# Patient Record
Sex: Female | Born: 1985 | Race: White | Hispanic: Yes | Marital: Married | Smoking: Never smoker
Health system: Southern US, Community
[De-identification: ages and names within clinical notes are randomized; demographics above are authoritative.]

## PROBLEM LIST (undated history)

## (undated) DIAGNOSIS — E1169 Type 2 diabetes mellitus with other specified complication: Secondary | ICD-10-CM

## (undated) DIAGNOSIS — E669 Obesity, unspecified: Principal | ICD-10-CM

## (undated) HISTORY — DX: Type 2 diabetes mellitus with other specified complication: E11.69

## (undated) HISTORY — DX: Obesity, unspecified: E66.9

---

## 2009-04-25 ENCOUNTER — Ambulatory Visit (HOSPITAL_COMMUNITY): Admission: RE | Admit: 2009-04-25 | Discharge: 2009-04-25 | Payer: Self-pay | Admitting: Family Medicine

## 2009-05-06 DIAGNOSIS — E1169 Type 2 diabetes mellitus with other specified complication: Secondary | ICD-10-CM

## 2009-05-06 DIAGNOSIS — E669 Obesity, unspecified: Secondary | ICD-10-CM

## 2009-05-06 HISTORY — DX: Type 2 diabetes mellitus with other specified complication: E66.9

## 2009-05-06 HISTORY — DX: Type 2 diabetes mellitus with other specified complication: E11.69

## 2009-09-09 ENCOUNTER — Ambulatory Visit (HOSPITAL_COMMUNITY): Admission: RE | Admit: 2009-09-09 | Discharge: 2009-09-09 | Payer: Self-pay | Admitting: General Surgery

## 2009-09-09 HISTORY — PX: LAPAROSCOPIC CHOLECYSTECTOMY: SUR755

## 2010-01-05 ENCOUNTER — Other Ambulatory Visit
Admission: RE | Admit: 2010-01-05 | Discharge: 2010-01-05 | Payer: Self-pay | Source: Home / Self Care | Admitting: Obstetrics & Gynecology

## 2010-04-21 LAB — SURGICAL PCR SCREEN
MRSA, PCR: NEGATIVE
Staphylococcus aureus: NEGATIVE

## 2010-04-21 LAB — BASIC METABOLIC PANEL
BUN: 8 mg/dL (ref 6–23)
Calcium: 9.8 mg/dL (ref 8.4–10.5)
Chloride: 105 mEq/L (ref 96–112)
Creatinine, Ser: 0.48 mg/dL (ref 0.4–1.2)
Sodium: 137 mEq/L (ref 135–145)

## 2010-04-21 LAB — CBC
Hemoglobin: 14 g/dL (ref 12.0–15.0)
MCH: 31.6 pg (ref 26.0–34.0)
MCHC: 34.5 g/dL (ref 30.0–36.0)
MCV: 91.5 fL (ref 78.0–100.0)
Platelets: 232 10*3/uL (ref 150–400)
RBC: 4.42 MIL/uL (ref 3.87–5.11)
RDW: 12.2 % (ref 11.5–15.5)
WBC: 5.7 10*3/uL (ref 4.0–10.5)

## 2010-04-21 LAB — GLUCOSE, CAPILLARY

## 2010-05-14 ENCOUNTER — Encounter: Payer: Self-pay | Admitting: Obstetrics & Gynecology

## 2010-05-14 DIAGNOSIS — E669 Obesity, unspecified: Secondary | ICD-10-CM

## 2010-05-14 DIAGNOSIS — I10 Essential (primary) hypertension: Secondary | ICD-10-CM | POA: Insufficient documentation

## 2010-05-14 DIAGNOSIS — O34219 Maternal care for unspecified type scar from previous cesarean delivery: Secondary | ICD-10-CM

## 2010-05-14 DIAGNOSIS — E119 Type 2 diabetes mellitus without complications: Secondary | ICD-10-CM | POA: Insufficient documentation

## 2010-05-16 DIAGNOSIS — O34219 Maternal care for unspecified type scar from previous cesarean delivery: Secondary | ICD-10-CM | POA: Insufficient documentation

## 2012-01-12 IMAGING — US US ABDOMEN COMPLETE
1 series · 14 of 25 positions shown · non-contrast
Comparison: None.

CLINICAL DATA: Right upper quadrant pain.

COMPLETE ABDOMINAL ULTRASOUND

[Series 1: us abdomen complete · 0.26mm/px · 14 of 56 slices shown]
[im 1/56]
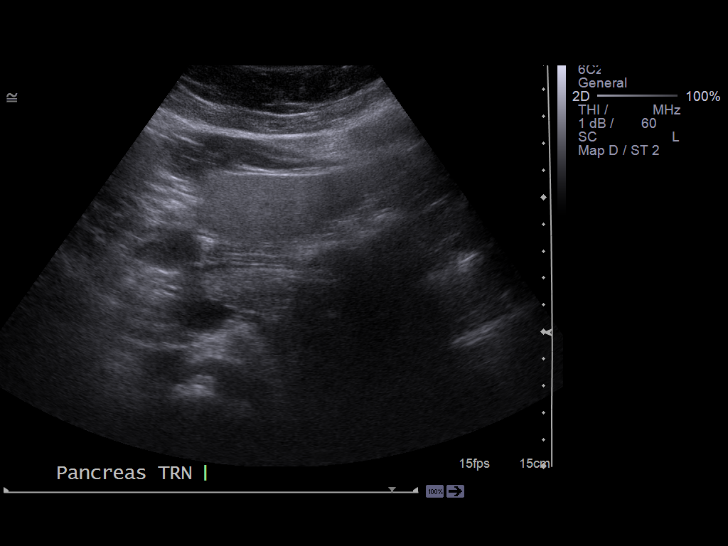
[im 5/56]
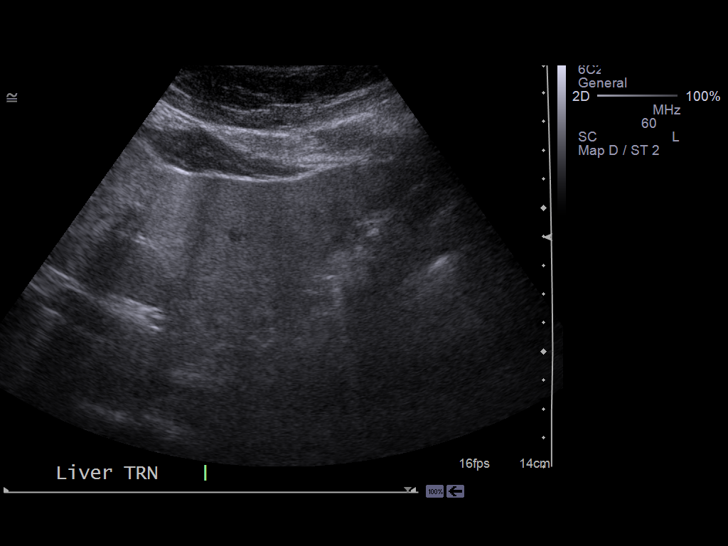
[im 10/56]
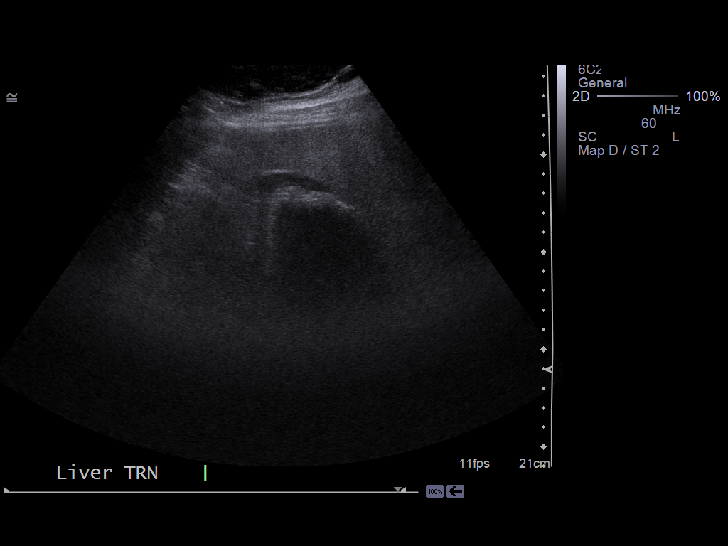
[im 14/56]
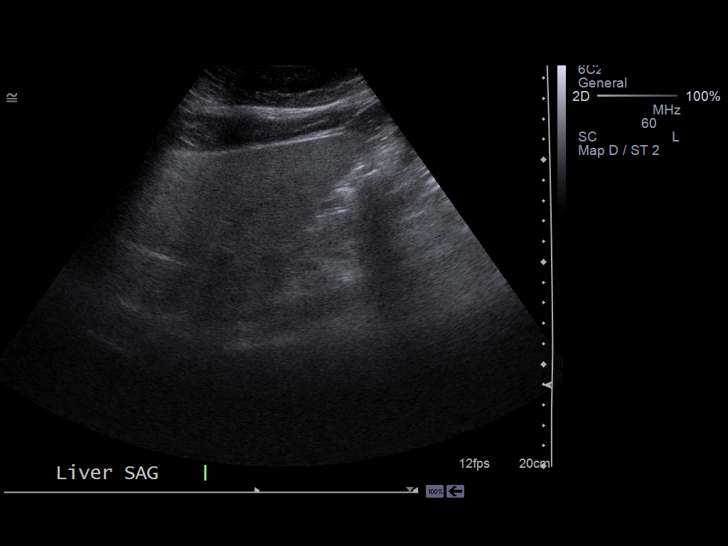
[im 19/56]
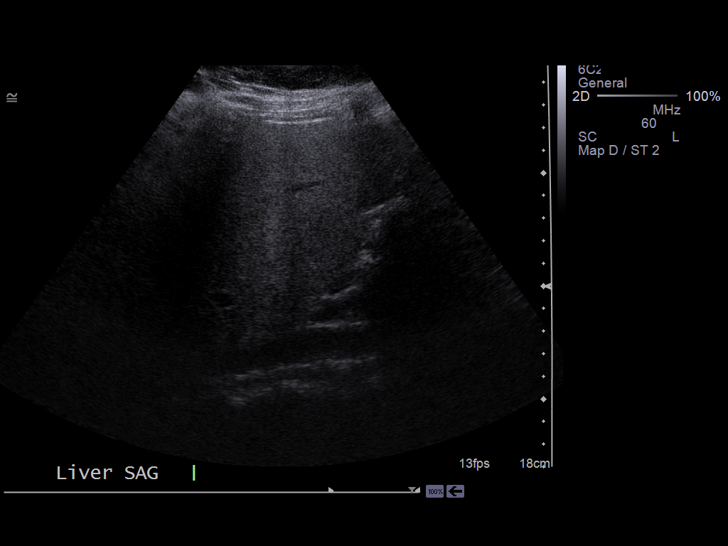
[im 21/56]
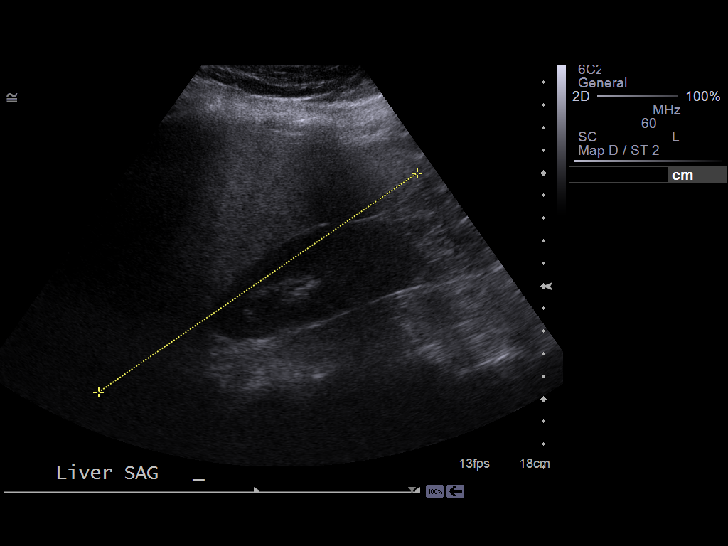
[im 26/56]
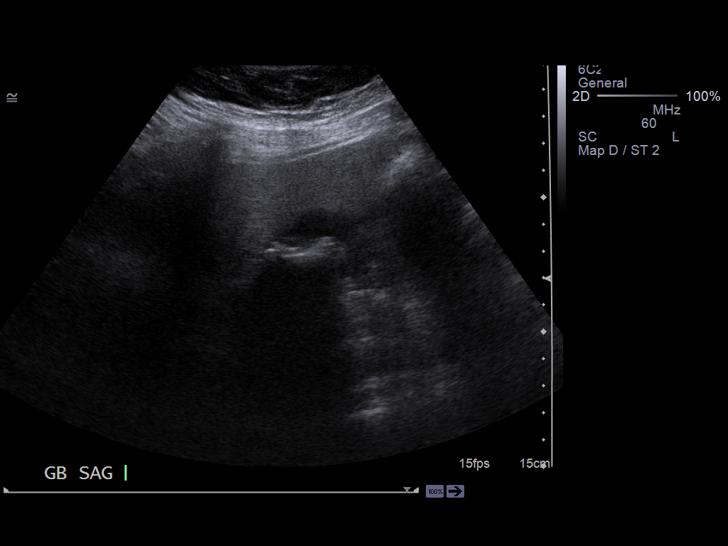
[im 30/56]
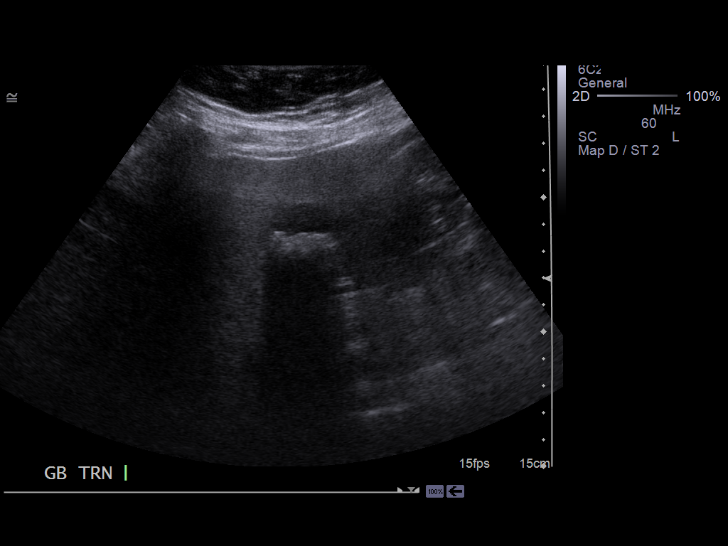
[im 35/56]
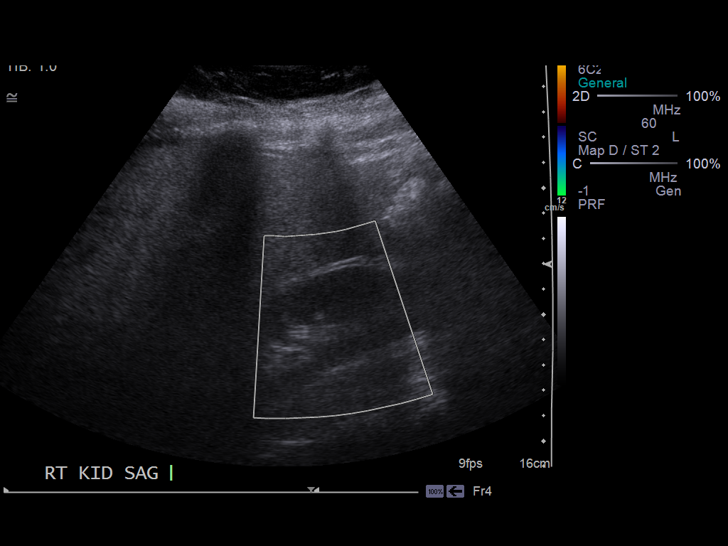
[im 37/56]
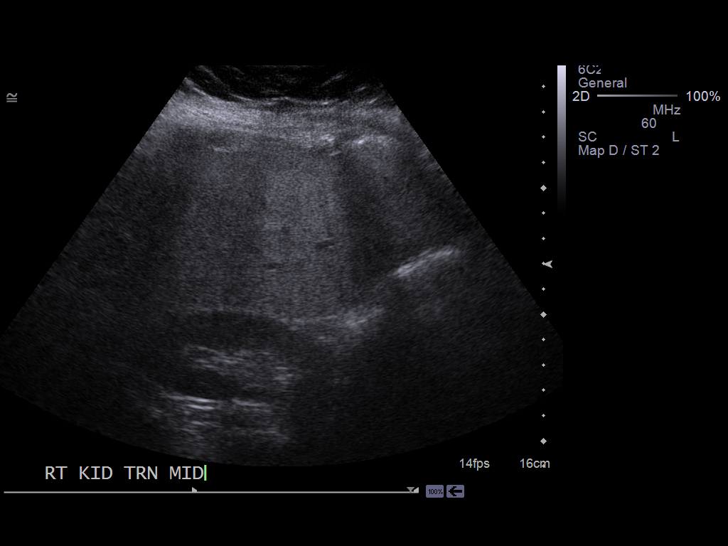
[im 42/56]
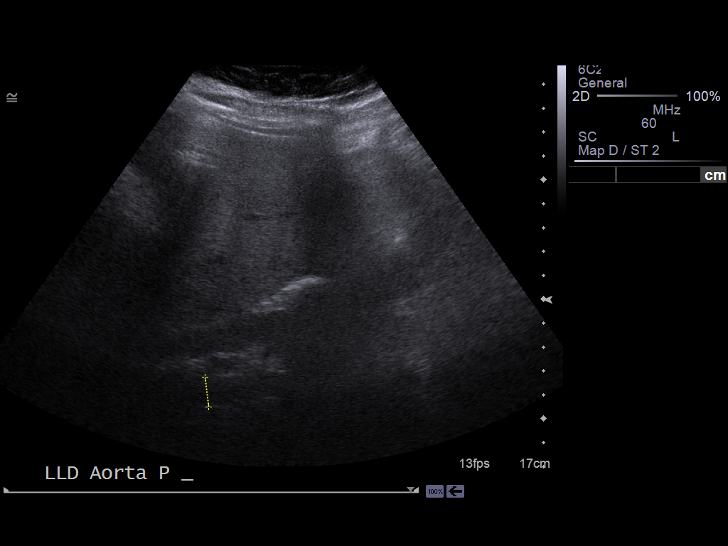
[im 46/56]
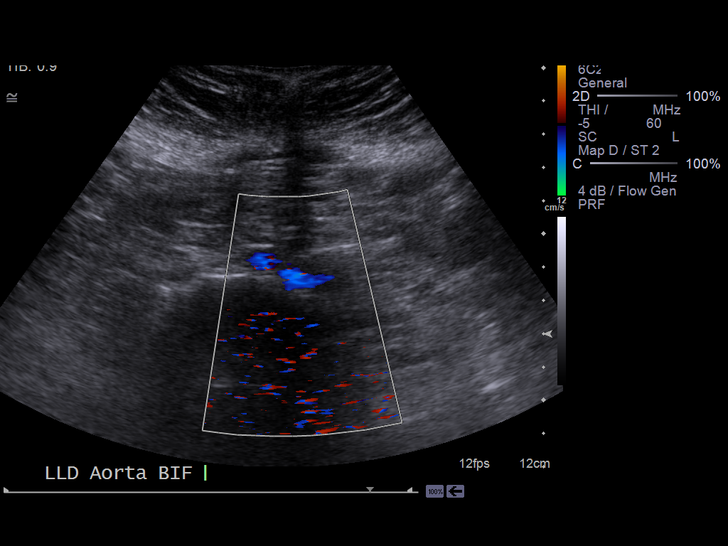
[im 51/56]
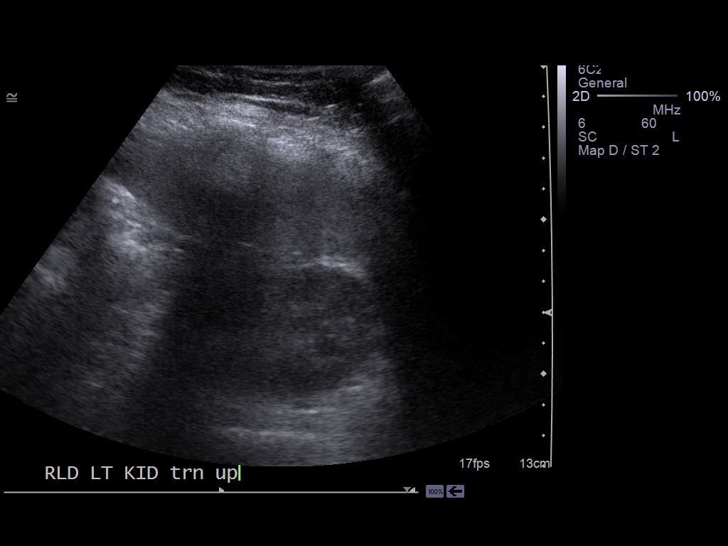
[im 56/56]
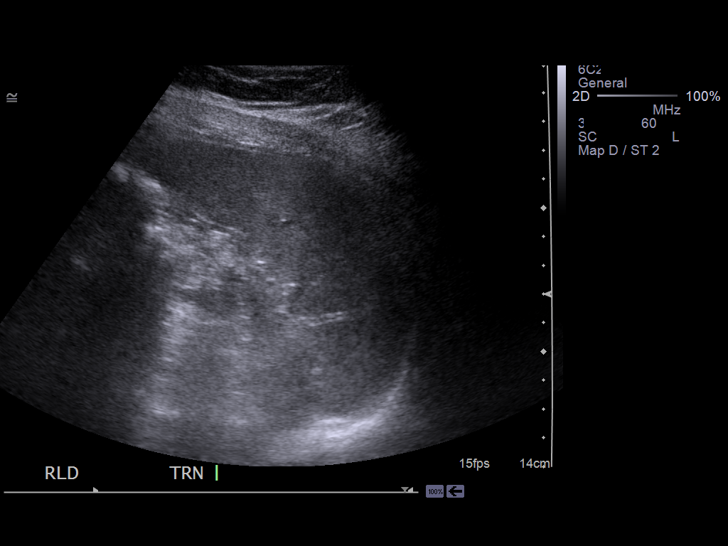

[14 of 25 positions shown; findings below may reference images not displayed]

FINDINGS: Gallbladder:  Multiple shadowing echogenic stones are seen in the
gallbladder.  No sonographic Murphy's sign or gallbladder wall
thickening.

Common bile duct:  Measures 4 mm, within normal limits.

Liver:  Diffusely increased in echogenicity.

IVC:  Visualized.

Pancreas:  Negative.

Spleen:  Measures 11.9 cm, negative.

Right Kidney:  Measures 10.4 cm, negative.

Left Kidney:  Measures 12.0 cm, negative.

Abdominal aorta:  No aneurysm identified.
IMPRESSION: 1.  Cholelithiasis without acute cholecystitis.
2.  Fatty liver.

## 2015-01-20 ENCOUNTER — Other Ambulatory Visit: Payer: Self-pay | Admitting: Obstetrics & Gynecology

## 2015-01-20 DIAGNOSIS — O3680X0 Pregnancy with inconclusive fetal viability, not applicable or unspecified: Secondary | ICD-10-CM

## 2015-01-24 ENCOUNTER — Other Ambulatory Visit: Payer: Self-pay | Admitting: Obstetrics & Gynecology

## 2015-01-24 ENCOUNTER — Ambulatory Visit (INDEPENDENT_AMBULATORY_CARE_PROVIDER_SITE_OTHER): Payer: Self-pay

## 2015-01-24 DIAGNOSIS — Z3A01 Less than 8 weeks gestation of pregnancy: Secondary | ICD-10-CM

## 2015-01-24 DIAGNOSIS — O3680X0 Pregnancy with inconclusive fetal viability, not applicable or unspecified: Secondary | ICD-10-CM

## 2015-01-24 DIAGNOSIS — O3481 Maternal care for other abnormalities of pelvic organs, first trimester: Secondary | ICD-10-CM

## 2015-01-24 NOTE — Progress Notes (Signed)
US TA/TV:  5+3wks,single IUP pos fht 105 bpm,normal rt ov, lt corpus luteal cyst (simple) 4.7 x 3.8 x 4.3cm,small amount of cul de sac fluid,crl 2mm

## 2015-02-08 ENCOUNTER — Encounter: Payer: Self-pay | Admitting: Advanced Practice Midwife

## 2015-02-23 ENCOUNTER — Ambulatory Visit (INDEPENDENT_AMBULATORY_CARE_PROVIDER_SITE_OTHER): Payer: Self-pay | Admitting: Advanced Practice Midwife

## 2015-02-23 ENCOUNTER — Encounter: Payer: Self-pay | Admitting: Advanced Practice Midwife

## 2015-02-23 ENCOUNTER — Other Ambulatory Visit (HOSPITAL_COMMUNITY)
Admission: RE | Admit: 2015-02-23 | Discharge: 2015-02-23 | Disposition: A | Discharge status: Home / Self Care | Payer: Self-pay | Source: Ambulatory Visit | Attending: Advanced Practice Midwife | Admitting: Advanced Practice Midwife

## 2015-02-23 VITALS — BP 120/70 | HR 74 | Ht 62.5 in | Wt 181.0 lb

## 2015-02-23 DIAGNOSIS — Z331 Pregnant state, incidental: Secondary | ICD-10-CM

## 2015-02-23 DIAGNOSIS — O10911 Unspecified pre-existing hypertension complicating pregnancy, first trimester: Secondary | ICD-10-CM

## 2015-02-23 DIAGNOSIS — Z369 Encounter for antenatal screening, unspecified: Secondary | ICD-10-CM

## 2015-02-23 DIAGNOSIS — Z1151 Encounter for screening for human papillomavirus (HPV): Secondary | ICD-10-CM | POA: Insufficient documentation

## 2015-02-23 DIAGNOSIS — O09899 Supervision of other high risk pregnancies, unspecified trimester: Secondary | ICD-10-CM | POA: Insufficient documentation

## 2015-02-23 DIAGNOSIS — Z124 Encounter for screening for malignant neoplasm of cervix: Secondary | ICD-10-CM

## 2015-02-23 DIAGNOSIS — Z1389 Encounter for screening for other disorder: Secondary | ICD-10-CM

## 2015-02-23 DIAGNOSIS — E1169 Type 2 diabetes mellitus with other specified complication: Secondary | ICD-10-CM

## 2015-02-23 DIAGNOSIS — E669 Obesity, unspecified: Secondary | ICD-10-CM

## 2015-02-23 DIAGNOSIS — Z01419 Encounter for gynecological examination (general) (routine) without abnormal findings: Secondary | ICD-10-CM | POA: Insufficient documentation

## 2015-02-23 DIAGNOSIS — O34219 Maternal care for unspecified type scar from previous cesarean delivery: Secondary | ICD-10-CM

## 2015-02-23 DIAGNOSIS — Z3A10 10 weeks gestation of pregnancy: Secondary | ICD-10-CM

## 2015-02-23 DIAGNOSIS — I10 Essential (primary) hypertension: Secondary | ICD-10-CM

## 2015-02-23 DIAGNOSIS — Z0283 Encounter for blood-alcohol and blood-drug test: Secondary | ICD-10-CM

## 2015-02-23 DIAGNOSIS — O09891 Supervision of other high risk pregnancies, first trimester: Secondary | ICD-10-CM

## 2015-02-23 LAB — POCT URINALYSIS DIPSTICK
Blood, UA: NEGATIVE
Ketones, UA: NEGATIVE
LEUKOCYTES UA: NEGATIVE
Nitrite, UA: NEGATIVE
PROTEIN UA: NEGATIVE

## 2015-02-23 MED ORDER — PRENATAL 27-0.8 MG PO TABS: 1.0000 | ORAL_TABLET | Freq: Every day | ORAL | Status: DC

## 2015-02-23 NOTE — Patient Instructions (Addendum)
Safe Medications in Pregnancy   Acne: Benzoyl Peroxide Salicylic Acid  Backache/Headache: Tylenol: 2 regular strength every 4 hours OR              2 Extra strength every 6 hours  Colds/Coughs/Allergies: Benadryl (alcohol free) 25 mg every 6 hours as needed Breath right strips Claritin Cepacol throat lozenges Chloraseptic throat spray Cold-Eeze- up to three times per day Cough drops, alcohol free Flonase (by prescription only) Guaifenesin Mucinex Robitussin DM (plain only, alcohol free) Saline nasal spray/drops Sudafed (pseudoephedrine) & Actifed ** use only after [redacted] weeks gestation and if you do not have high blood pressure Tylenol Vicks Vaporub Zinc lozenges Zyrtec   Constipation: Colace Ducolax suppositories Fleet enema Glycerin suppositories Metamucil Milk of magnesia Miralax Senokot Smooth move tea  Diarrhea: Kaopectate Imodium A-D  *NO pepto Bismol  Hemorrhoids: Anusol Anusol HC Preparation H Tucks  Indigestion: Tums Maalox Mylanta Zantac  Pepcid  Insomnia: Benadryl (alcohol free) 25mg every 6 hours as needed Tylenol PM Unisom, no Gelcaps  Leg Cramps: Tums MagGel  Nausea/Vomiting:  Bonine Dramamine Emetrol Ginger extract Sea bands Meclizine  Nausea medication to take during pregnancy:  Unisom (doxylamine succinate 25 mg tablets) Take one tablet daily at bedtime. If symptoms are not adequately controlled, the dose can be increased to a maximum recommended dose of two tablets daily (1/2 tablet in the morning, 1/2 tablet mid-afternoon and one at bedtime). Vitamin B6 100mg tablets. Take one tablet twice a day (up to 200 mg per day).  Skin Rashes: Aveeno products Benadryl cream or 25mg every 6 hours as needed Calamine Lotion 1% cortisone cream  Yeast infection: Gyne-lotrimin 7 Monistat 7   **If taking multiple medications, please check labels to avoid duplicating the same active ingredients **take medication as directed on  the label ** Do not exceed 4000 mg of tylenol in 24 hours **Do not take medications that contain aspirin or ibuprofen    First Trimester of Pregnancy The first trimester of pregnancy is from week 1 until the end of week 12 (months 1 through 3). A week after a sperm fertilizes an egg, the egg will implant on the wall of the uterus. This embryo will begin to develop into a baby. Genes from you and your partner are forming the baby. The female genes determine whether the baby is a boy or a girl. At 6-8 weeks, the eyes and face are formed, and the heartbeat can be seen on ultrasound. At the end of 12 weeks, all the baby's organs are formed.  Now that you are pregnant, you will want to do everything you can to have a healthy baby. Two of the most important things are to get good prenatal care and to follow your health care provider's instructions. Prenatal care is all the medical care you receive before the baby's birth. This care will help prevent, find, and treat any problems during the pregnancy and childbirth. BODY CHANGES Your body goes through many changes during pregnancy. The changes vary from woman to woman.   You may gain or lose a couple of pounds at first.  You may feel sick to your stomach (nauseous) and throw up (vomit). If the vomiting is uncontrollable, call your health care provider.  You may tire easily.  You may develop headaches that can be relieved by medicines approved by your health care provider.  You may urinate more often. Painful urination may mean you have a bladder infection.  You may develop heartburn as a result of your   pregnancy.  You may develop constipation because certain hormones are causing the muscles that push waste through your intestines to slow down.  You may develop hemorrhoids or swollen, bulging veins (varicose veins).  Your breasts may begin to grow larger and become tender. Your nipples may stick out more, and the tissue that surrounds them (areola)  may become darker.  Your gums may bleed and may be sensitive to brushing and flossing.  Dark spots or blotches (chloasma, mask of pregnancy) may develop on your face. This will likely fade after the baby is born.  Your menstrual periods will stop.  You may have a loss of appetite.  You may develop cravings for certain kinds of food.  You may have changes in your emotions from day to day, such as being excited to be pregnant or being concerned that something may go wrong with the pregnancy and baby.  You may have more vivid and strange dreams.  You may have changes in your hair. These can include thickening of your hair, rapid growth, and changes in texture. Some women also have hair loss during or after pregnancy, or hair that feels dry or thin. Your hair will most likely return to normal after your baby is born. WHAT TO EXPECT AT YOUR PRENATAL VISITS During a routine prenatal visit:  You will be weighed to make sure you and the baby are growing normally.  Your blood pressure will be taken.  Your abdomen will be measured to track your baby's growth.  The fetal heartbeat will be listened to starting around week 10 or 12 of your pregnancy.  Test results from any previous visits will be discussed. Your health care provider may ask you:  How you are feeling.  If you are feeling the baby move.  If you have had any abnormal symptoms, such as leaking fluid, bleeding, severe headaches, or abdominal cramping.  If you are using any tobacco products, including cigarettes, chewing tobacco, and electronic cigarettes.  If you have any questions. Other tests that may be performed during your first trimester include:  Blood tests to find your blood type and to check for the presence of any previous infections. They will also be used to check for low iron levels (anemia) and Rh antibodies. Later in the pregnancy, blood tests for diabetes will be done along with other tests if problems  develop.  Urine tests to check for infections, diabetes, or protein in the urine.  An ultrasound to confirm the proper growth and development of the baby.  An amniocentesis to check for possible genetic problems.  Fetal screens for spina bifida and Down syndrome.  You may need other tests to make sure you and the baby are doing well.  HIV (human immunodeficiency virus) testing. Routine prenatal testing includes screening for HIV, unless you choose not to have this test. HOME CARE INSTRUCTIONS  Medicines  Follow your health care provider's instructions regarding medicine use. Specific medicines may be either safe or unsafe to take during pregnancy.  Take your prenatal vitamins as directed.  If you develop constipation, try taking a stool softener if your health care provider approves. Diet  Eat regular, well-balanced meals. Choose a variety of foods, such as meat or vegetable-based protein, fish, milk and low-fat dairy products, vegetables, fruits, and whole grain breads and cereals. Your health care provider will help you determine the amount of weight gain that is right for you.  Avoid raw meat and uncooked cheese. These carry germs that can cause  birth defects in the baby.  Eating four or five small meals rather than three large meals a day may help relieve nausea and vomiting. If you start to feel nauseous, eating a few soda crackers can be helpful. Drinking liquids between meals instead of during meals also seems to help nausea and vomiting.  If you develop constipation, eat more high-fiber foods, such as fresh vegetables or fruit and whole grains. Drink enough fluids to keep your urine clear or pale yellow. Activity and Exercise  Exercise only as directed by your health care provider. Exercising will help you:  Control your weight.  Stay in shape.  Be prepared for labor and delivery.  Experiencing pain or cramping in the lower abdomen or low back is a good sign that you  should stop exercising. Check with your health care provider before continuing normal exercises.  Try to avoid standing for long periods of time. Move your legs often if you must stand in one place for a long time.  Avoid heavy lifting.  Wear low-heeled shoes, and practice good posture.  You may continue to have sex unless your health care provider directs you otherwise. Relief of Pain or Discomfort  Wear a good support bra for breast tenderness.   Take warm sitz baths to soothe any pain or discomfort caused by hemorrhoids. Use hemorrhoid cream if your health care provider approves.   Rest with your legs elevated if you have leg cramps or low back pain.  If you develop varicose veins in your legs, wear support hose. Elevate your feet for 15 minutes, 3-4 times a day. Limit salt in your diet. Prenatal Care  Schedule your prenatal visits by the twelfth week of pregnancy. They are usually scheduled monthly at first, then more often in the last 2 months before delivery.  Write down your questions. Take them to your prenatal visits.  Keep all your prenatal visits as directed by your health care provider. Safety  Wear your seat belt at all times when driving.  Make a list of emergency phone numbers, including numbers for family, friends, the hospital, and police and fire departments. General Tips  Ask your health care provider for a referral to a local prenatal education class. Begin classes no later than at the beginning of month 6 of your pregnancy.  Ask for help if you have counseling or nutritional needs during pregnancy. Your health care provider can offer advice or refer you to specialists for help with various needs.  Do not use hot tubs, steam rooms, or saunas.  Do not douche or use tampons or scented sanitary pads.  Do not cross your legs for long periods of time.  Avoid cat litter boxes and soil used by cats. These carry germs that can cause birth defects in the baby  and possibly loss of the fetus by miscarriage or stillbirth.  Avoid all smoking, herbs, alcohol, and medicines not prescribed by your health care provider. Chemicals in these affect the formation and growth of the baby.  Do not use any tobacco products, including cigarettes, chewing tobacco, and electronic cigarettes. If you need help quitting, ask your health care provider. You may receive counseling support and other resources to help you quit.  Schedule a dentist appointment. At home, brush your teeth with a soft toothbrush and be gentle when you floss. SEEK MEDICAL CARE IF:   You have dizziness.  You have mild pelvic cramps, pelvic pressure, or nagging pain in the abdominal area.  You have persistent   nausea, vomiting, or diarrhea.  You have a bad smelling vaginal discharge.  You have pain with urination.  You notice increased swelling in your face, hands, legs, or ankles. SEEK IMMEDIATE MEDICAL CARE IF:   You have a fever.  You are leaking fluid from your vagina.  You have spotting or bleeding from your vagina.  You have severe abdominal cramping or pain.  You have rapid weight gain or loss.  You vomit blood or material that looks like coffee grounds.  You are exposed to [place] measles and have never had them.  You are exposed to fifth disease or chickenpox.  You develop a severe headache.  You have shortness of breath.  You have any kind of trauma, such as from a fall or a car accident.   This information is not intended to replace advice given to you by your health care provider. Make sure you discuss any questions you have with your health care provider.   Document Released: 01/16/2001 Document Revised: 02/12/2014 Document Reviewed: 12/02/2012 Elsevier Interactive Patient Education 2016 [employer] Take blood sugar before you eat and 2 hours after each meal  Write this down in a notebook and bring to every appointment.    Appointment is with [facility] (I will call you with the appointment date/time)   [facility] [address] [place], [place], [phone]

## 2015-02-23 NOTE — Progress Notes (Signed)
Subjective:    Lindsay Morrison is a G2P1001 [redacted]w[redacted]d being seen today for her first obstetrical visit.  Her obstetrical history is significant for 2 CS.  Pregnancy history fully reviewed. She was dx with Type 2 DM and HTN "a few years ago".  She has not been on BP meds since 1 month before getting pregnant: was told not to take lisinopril while trying to conceive, but was not given a replacement.  Takes Novolin 10 BID, but hasn't checked her BS "in 2 years" d/t cost of test strips, meter.    Patient reports no complaints.  Filed Vitals:   02/23/15 1055 02/23/15 1223  BP: 120/70   Pulse: 74   Height:  5' 2.5" (1.588 m)  Weight: 181 lb (82.101 kg)     HISTORY: OB History  Gravida Para Term Preterm AB SAB TAB Ectopic Multiple Living  0 0 0 0 0 0 1    # Outcome Date GA Lbr Len/2nd Weight Sex Delivery Anes PTL Lv  3 Current           2 Gravida 08/12/10   11 lb 4 oz (5.103 kg) F CS-LTranv  N   1 Term 2004 [redacted]w[redacted]d  8 lb 8 oz (3.856 kg) M CS-Unspec   Y     Comments: unknown scar     Past Medical History  Diagnosis Date  . Hypertension   . Diabetes mellitus type 2 in obese (HCC) 05/2009    Began on Metformin 05/2009, patient stopped taking on her own 11/2009   Past Surgical History  Procedure Laterality Date  . Laparoscopic cholecystectomy  09/09/2009    Dr.Jenkins  . Cesarean section  2004    Performed in [place] due to fetal distress unknown scar   No family history on file.   Exam       Pelvic Exam:    Perineum: Normal Perineum   Vulva: normal   Vagina:  normal mucosa, normal discharge, no palpable nodules   Uterus Normal, Gravid, FH: 9     Cervix: normal   Adnexa: Not palpable   Urinary:  urethral meatus normal    System:     Skin: normal coloration and turgor, no rashes    Neurologic: oriented, normal, normal mood   Extremities: normal strength, tone, and muscle mass   HEENT PERRLA   Mouth/Teeth mucous membranes moist, normal dentition   Neck supple and no  masses   Cardiovascular: regular rate and rhythm   Respiratory:  appears well, vitals normal, no respiratory distress, acyanotic   Abdomen: soft, non-tender;  FHR: 150us          Assessment:    Pregnancy: G2P1001 Patient Active Problem List   Diagnosis Date Noted  . Supervision of normal pregnancy 02/23/2015  . Previous cesarean delivery, delivered 05/16/2010  . Diabetes mellitus type 2 in obese (HCC)   . Hypertension         Plan:     Initial labs ordered, but not drawn (was going to cost  > $600.  Hgb A1C added to PN1.    Rx for prenatal vitamins sent Problem list reviewed and updated  Encouraged well-balanced diet Genetic Screening discussed Quad Screen: requested.  Ultrasound discussed; fetal survey: requested.  Discussed transferring care to [facility] d/t having to pay for each PNV at [facility], and that she will need twice weekly visits after 32 weeks d/t DM/HTN--pt wants to do that Appointment made for 03/07/15 at 0930.  CRESENZO-DISHMAN,Airen Dales 02/23/2015

## 2015-02-25 LAB — CYTOLOGY - PAP

## 2015-03-07 ENCOUNTER — Ambulatory Visit: Payer: Self-pay | Admitting: *Deleted

## 2015-03-07 ENCOUNTER — Encounter: Payer: Self-pay | Attending: Family Medicine | Admitting: *Deleted

## 2015-03-07 VITALS — Ht 62.0 in | Wt 181.0 lb

## 2015-03-07 DIAGNOSIS — Z3A11 11 weeks gestation of pregnancy: Secondary | ICD-10-CM | POA: Insufficient documentation

## 2015-03-07 DIAGNOSIS — O24419 Gestational diabetes mellitus in pregnancy, unspecified control: Secondary | ICD-10-CM | POA: Insufficient documentation

## 2015-03-07 NOTE — Progress Notes (Signed)
Nutrition note: DM diet education Pt has h/o obesity; pt reports she has had DM for 5 years and has been on insulin the past 2 years. Pt reports eating 2 meals & 2 snacks/d. Pt is taking a PNV. Pt reports nausea occ. & no heartburn. NKFA. Pt reports no walking or physical activity. Pt received verbal & written education in Spanish via an interpreter about DM diet during pregnancy. Encouraged ~30 mins of walking/d. Pt does not have WIC & was told by her lawyer not to while she is pregnant because they are trying to get residency here. Pt stated she does not plan to BF because she will be re-starting Lisinopril once she gives birth - plan to look this medication up in the Medications & Mother's Milk book. Pt agrees to follow DM diet with 3 meals & 1-3 snacks/d with proper CHO/ protein combination. F/u in 2-4 wks Blondell Reveal, MS, RD, LDN, [facility]

## 2015-03-09 NOTE — Progress Notes (Signed)
  Patient was seen on 03/07/15 for Gestational Diabetes self-management . Spanish Interpreter Merrie Roof for visit.  The following learning objectives were met by the patient :   States the definition of Gestational Diabetes  States when to check blood glucose levels  Demonstrates proper blood glucose monitoring techniques  States the effect of stress and exercise on blood glucose levels  States the importance of limiting caffeine and abstaining from alcohol and smoking  Plan:  Consider  increasing your activity level by walking daily as tolerated Begin checking BG before breakfast and 1-2 hours after first bit of breakfast, lunch and dinner after  as directed by MD  Take medication  as directed by MD  Blood glucose monitor given: True Track Lot # Lot [id number] Exp: 03/31/17 Blood glucose reading: FBS '102mg'$ /dl  Patient instructed to monitor glucose levels: FBS: 60 - <90 2 hour: <120  Patient received the following handouts:  Nutrition Diabetes and Pregnancy  Patient will be seen for follow-up as needed.

## 2015-03-14 ENCOUNTER — Encounter: Payer: Self-pay | Admitting: Family Medicine

## 2015-03-14 ENCOUNTER — Ambulatory Visit (INDEPENDENT_AMBULATORY_CARE_PROVIDER_SITE_OTHER): Payer: Self-pay | Admitting: Family Medicine

## 2015-03-14 VITALS — BP 115/67 | HR 79 | Temp 98.5°F | Wt 181.0 lb

## 2015-03-14 DIAGNOSIS — O99211 Obesity complicating pregnancy, first trimester: Secondary | ICD-10-CM

## 2015-03-14 DIAGNOSIS — O09291 Supervision of pregnancy with other poor reproductive or obstetric history, first trimester: Secondary | ICD-10-CM

## 2015-03-14 DIAGNOSIS — O09299 Supervision of pregnancy with other poor reproductive or obstetric history, unspecified trimester: Secondary | ICD-10-CM | POA: Insufficient documentation

## 2015-03-14 DIAGNOSIS — O09891 Supervision of other high risk pregnancies, first trimester: Secondary | ICD-10-CM

## 2015-03-14 DIAGNOSIS — O24911 Unspecified diabetes mellitus in pregnancy, first trimester: Secondary | ICD-10-CM

## 2015-03-14 DIAGNOSIS — O169 Unspecified maternal hypertension, unspecified trimester: Secondary | ICD-10-CM | POA: Insufficient documentation

## 2015-03-14 DIAGNOSIS — O161 Unspecified maternal hypertension, first trimester: Secondary | ICD-10-CM

## 2015-03-14 DIAGNOSIS — O99210 Obesity complicating pregnancy, unspecified trimester: Secondary | ICD-10-CM | POA: Insufficient documentation

## 2015-03-14 DIAGNOSIS — Z113 Encounter for screening for infections with a predominantly sexual mode of transmission: Secondary | ICD-10-CM

## 2015-03-14 DIAGNOSIS — O34219 Maternal care for unspecified type scar from previous cesarean delivery: Secondary | ICD-10-CM

## 2015-03-14 DIAGNOSIS — O24919 Unspecified diabetes mellitus in pregnancy, unspecified trimester: Secondary | ICD-10-CM

## 2015-03-14 DIAGNOSIS — E669 Obesity, unspecified: Secondary | ICD-10-CM

## 2015-03-14 LAB — COMPREHENSIVE METABOLIC PANEL
ALBUMIN: 4 g/dL (ref 3.6–5.1)
ALT: 10 U/L (ref 6–29)
AST: 13 U/L (ref 10–30)
Alkaline Phosphatase: 49 U/L (ref 33–115)
BUN: 6 mg/dL — ABNORMAL LOW (ref 7–25)
CHLORIDE: 104 mmol/L (ref 98–110)
CO2: 22 mmol/L (ref 20–31)
CREATININE: 0.43 mg/dL — AB (ref 0.50–1.10)
Calcium: 9.1 mg/dL (ref 8.6–10.2)
Glucose, Bld: 97 mg/dL (ref 65–99)
POTASSIUM: 3.8 mmol/L (ref 3.5–5.3)
SODIUM: 136 mmol/L (ref 135–146)
TOTAL PROTEIN: 7 g/dL (ref 6.1–8.1)
Total Bilirubin: 0.3 mg/dL (ref 0.2–1.2)

## 2015-03-14 LAB — POCT URINALYSIS DIP (DEVICE)
Bilirubin Urine: NEGATIVE
GLUCOSE, UA: 100 mg/dL — AB
HGB URINE DIPSTICK: NEGATIVE
Ketones, ur: NEGATIVE mg/dL
Leukocytes, UA: NEGATIVE
NITRITE: NEGATIVE
PROTEIN: NEGATIVE mg/dL
SPECIFIC GRAVITY, URINE: 1.015 (ref 1.005–1.030)
UROBILINOGEN UA: 1 mg/dL (ref 0.0–1.0)
pH: 6 (ref 5.0–8.0)

## 2015-03-14 LAB — HEMOGLOBIN A1C
Hgb A1c MFr Bld: 5.6 % (ref <5.7)
MEAN PLASMA GLUCOSE: 114 mg/dL (ref <117)

## 2015-03-14 MED ORDER — ASPIRIN EC 81 MG PO TBEC: 81.0000 mg | DELAYED_RELEASE_TABLET | Freq: Every day | ORAL | Status: DC

## 2015-03-14 NOTE — Progress Notes (Signed)
Subjective:  Lindsay Morrison is a 30 y.o. G3P2001 at [redacted]w[redacted]d being seen today for ongoing prenatal care.  She is currently monitored for the following issues for this high-risk pregnancy and has Diabetes mellitus affecting pregnancy, antepartum; Hypertension; Previous cesarean delivery, delivered; Supervision of other high-risk pregnancy; Hypertension affecting pregnancy; and Obesity affecting pregnancy, antepartum on her problem list.  Patient reports no complaints.  Contractions: Not present. Vag. Bleeding: None.  Movement: Absent. Denies leaking of fluid.   The following portions of the patient's history were reviewed and updated as appropriate: allergies, current medications, past family history, past medical history, past social history, past surgical history and problem list. Problem list updated.  Objective:   Filed Vitals:   03/14/15 0808  BP: 115/67  Pulse: 79  Temp: 98.5 F (36.9 C)  Weight: 181 lb (82.101 kg)    Fetal Status: Fetal Heart Rate (bpm): 150   Movement: Absent     General:  Alert, oriented and cooperative. Patient is in no acute distress.  Skin: Skin is warm and dry. No rash noted.   Cardiovascular: Normal heart rate noted  Respiratory: Normal respiratory effort, no problems with respiration noted  Abdomen: Soft, gravid, appropriate for gestational age. Pain/Pressure: Absent     Pelvic: Vag. Bleeding: None     Cervical exam deferred        Extremities: Normal range of motion.  Edema: None  Mental Status: Normal mood and affect. Normal behavior. Normal judgment and thought content.   Urinalysis: Urine Protein: Negative Urine Glucose: 1+  Assessment and Plan:  Pregnancy: G3P2001 at [redacted]w[redacted]d  1. Supervision of other high-risk pregnancy, first trimester - Added pregnancy box - Prenatal (OB Panel) - [place] MFM OB COMP + 14 WK; Future - Hemoglobin A1C - Comprehensive metabolic panel - Protein / Creatinine Ratio, Urine - GC/Chlamydia probe amp ([place])not at  [facility]  2. Previous cesarean delivery, delivered Plan for CS   3. Diabetes mellitus affecting pregnancy, first trimester (HCC) - Currently on NPH 10 u, not on prandial - Hemoglobin A1C today - Comprehensive metabolic panel - Protein / Creatinine Ratio, Urine - appt with DM education in 1 week to review sugar, consider starting regular insulin  4. Hypertension affecting pregnancy, first trimester - patient reports she "does not have HTN" and was only on BP med for her "kidneys"  5. Obesity affecting pregnancy, antepartum, first trimester - recommend 11-15 lbs weight gain   Preterm labor symptoms and general obstetric precautions including but not limited to vaginal bleeding, contractions, leaking of fluid and fetal movement were reviewed in detail with the patient. Please refer to After Visit Summary for other counseling recommendations.  Return in about 2 weeks (around 03/28/2015) for Routine prenatal care.   Federico Flake, MD

## 2015-03-14 NOTE — Progress Notes (Signed)
U/S scheduled for 05/02/2015 :30AM

## 2015-03-14 NOTE — Progress Notes (Signed)
Used Lindsay Morrison. Here for first visit. Transferring from [facility]. Given new pregnancy information. C/o pain/ feels like something loose  when turns in bed at night. Declines flu.

## 2015-03-14 NOTE — Patient Instructions (Signed)
Primer trimestre de embarazo  (First Trimester of Pregnancy)  El primer trimestre de embarazo se extiende desde la semana 1 hasta el final de la semana 12 (mes 1 al mes 3). Una semana después de que un espermatozoide fecunda un óvulo, este se implantará en la pared uterina. Este embrión comenzará a desarrollarse hasta convertirse en un bebé. Sus genes y los de su pareja forman el bebé. Los genes del varón determinan si será un niño o una niña. Entre la semana 6 y la 8, se forman los ojos y el rostro, y los latidos del corazón pueden verse en la ecografía. Al final de las 12 semanas, todos los órganos del bebé están formados.   Ahora que está embarazada, querrá hacer todo lo que esté a su alcance para tener un bebé sano. Dos de las cosas más importantes son tener una buena atención prenatal y seguir las indicaciones del médico. La atención prenatal incluye toda la asistencia médica que usted recibe antes del nacimiento del bebé. Esta ayudará a prevenir, detectar y tratar cualquier problema durante el embarazo y el parto.  CAMBIOS EN EL ORGANISMO  Su organismo atraviesa por muchos cambios durante el embarazo, y estos varían de una mujer a otra.   · Al principio, puede aumentar o bajar algunos kilos.  · Puede tener malestar estomacal (náuseas) y vomitar. Si no puede controlar los vómitos, llame al médico.  · Puede cansarse con facilidad.  · Es posible que tenga dolores de cabeza que pueden aliviarse con los medicamentos que el médico le permita tomar.  · Puede orinar con mayor frecuencia. El dolor al orinar puede significar que usted tiene una infección de la vejiga.  · Debido al embarazo, puede tener acidez estomacal.  · Puede estar estreñida, ya que ciertas hormonas enlentecen los movimientos de los músculos que empujan los desechos a través de los intestinos.  · Pueden aparecer hemorroides o abultarse e hincharse las venas (venas varicosas).  · Las mamas pueden empezar a agrandarse y estar sensibles. Los pezones  pueden sobresalir más, y el tejido que los rodea (areola) tornarse más oscuro.  · Las encías pueden sangrar y estar sensibles al cepillado y al hilo dental.  · Pueden aparecer zonas oscuras o manchas (cloasma, máscara del embarazo) en el rostro que probablemente se atenuarán después del nacimiento del bebé.  · Los períodos menstruales se interrumpirán.  · Tal vez no tenga apetito.  · Puede sentir un fuerte deseo de consumir ciertos alimentos.  · Puede tener cambios a nivel emocional día a día, por ejemplo, por momentos puede estar emocionada por el embarazo y por otros preocuparse porque algo pueda salir mal con el embarazo o el bebé.  · Tendrá sueños más vívidos y extraños.  · Tal vez haya cambios en el cabello que pueden incluir su engrosamiento, crecimiento rápido y cambios en la textura. A algunas mujeres también se les cae el cabello durante o después del embarazo, o tienen el cabello seco o fino. Lo más probable es que el cabello se le normalice después del nacimiento del bebé.  QUÉ DEBE ESPERAR EN LAS CONSULTAS PRENATALES  Durante una visita prenatal de rutina:  · La pesarán para asegurarse de que usted y el bebé están creciendo normalmente.  · Le controlarán la presión arterial.  · Le medirán el abdomen para controlar el desarrollo del bebé.  · Se escucharán los latidos cardíacos a partir de la semana 10 o la 12 de embarazo, aproximadamente.  · Se analizarán los resultados de los estudios solicitados en visitas anteriores.  El   médico puede preguntarle:  · Cómo se siente.  · Si siente los movimientos del bebé.  · Si ha tenido síntomas anormales, como pérdida de líquido, sangrado, dolores de cabeza intensos o cólicos abdominales.  · Si está consumiendo algún producto que contenga tabaco, como cigarrillos, tabaco de mascar y cigarrillos electrónicos.  · Si tiene alguna pregunta.  Otros estudios que pueden realizarse durante el primer trimestre incluyen lo siguiente:  · Análisis de sangre para determinar el tipo  de sangre y detectar la presencia de infecciones previas. Además, se los usará para controlar si los niveles de hierro son bajos (anemia) y determinar los anticuerpos Rh. En una etapa más avanzada del embarazo, se harán análisis de sangre para saber si tiene diabetes, junto con otros estudios si surgen problemas.  · Análisis de orina para detectar infecciones, diabetes o proteínas en la orina.  · Una ecografía para confirmar que el bebé crece y se desarrolla correctamente.  · Una amniocentesis para diagnosticar posibles problemas genéticos.  · Estudios del feto para descartar espina bífida y síndrome de Down.  · Es posible que necesite otras pruebas adicionales.  · Prueba del VIH (virus de inmunodeficiencia humana). Los exámenes prenatales de rutina incluyen la prueba de detección del VIH, a menos que decida no realizársela.  INSTRUCCIONES PARA EL CUIDADO EN EL HOGAR   Medicamentos:  · Siga las indicaciones del médico en relación con el uso de medicamentos. Durante el embarazo, hay medicamentos que pueden tomarse y otros que no.  · Tome las vitaminas prenatales como se le indicó.  · Si está estreñida, tome un laxante suave, si el médico lo autoriza.  Dieta  · Consuma alimentos balanceados. Elija alimentos variados, como carne o proteínas de origen vegetal, pescado, leche y productos lácteos descremados, verduras, frutas y panes y cereales integrales. El médico la ayudará a determinar la cantidad de peso que puede aumentar.  · No coma carne cruda ni quesos sin cocinar. Estos elementos contienen bacterias que pueden causar defectos congénitos en el bebé.  · La ingesta diaria de cuatro o cinco comidas pequeñas en lugar de tres comidas abundantes puede ayudar a aliviar las náuseas y los vómitos. Si empieza a tener náuseas, comer algunas galletas saladas puede ser de ayuda. Beber líquidos entre las comidas en lugar de tomarlos durante las comidas también puede ayudar a calmar las náuseas y los vómitos.  · Si está  estreñida, consuma alimentos con alto contenido de fibra, como verduras y frutas frescas, y cereales integrales. Beba suficiente líquido para mantener la orina clara o de color amarillo pálido.  Actividad y ejercicios  · Haga ejercicio solamente como se lo haya indicado el médico. El ejercicio la ayudará a:    Controlar el peso.    Mantenerse en forma.    Estar preparada para el trabajo de parto y el parto.  · Los dolores, los cólicos en la parte baja del abdomen o los calambres en la cintura son un buen indicio de que debe dejar de hacer ejercicios. Consulte al médico antes de seguir haciendo ejercicios normales.  · Intente no estar de pie durante mucho tiempo. Mueva las piernas con frecuencia si debe estar de pie en un lugar durante mucho tiempo.  · Evite levantar pesos excesivos.  · Use zapatos de tacones bajos y mantenga una buena postura.  · Puede seguir teniendo relaciones sexuales, excepto que el médico le indique lo contrario.  Alivio del dolor o las molestias  · Use un sostén que le brinde buen   soporte si siente dolor a la palpación en las mamas.  · Dese baños de asiento con agua tibia para aliviar el dolor o las molestias causadas por las hemorroides. Use crema antihemorroidal si el médico se lo permite.  · Descanse con las piernas elevadas si tiene calambres o dolor de cintura.  · Si tiene venas varicosas en las piernas, use medias de descanso. Eleve los pies durante 15 minutos, 3 o 4 veces por día. Limite la cantidad de sal en su dieta.  Cuidados prenatales  · Programe las visitas prenatales para la semana 12 de embarazo. Generalmente se programan cada mes al principio y se hacen más frecuentes en los 2 últimos meses antes del parto.  · Escriba sus preguntas. Llévelas cuando concurra a las visitas prenatales.  · Concurra a todas las visitas prenatales como se lo haya indicado el médico.  Seguridad  · Colóquese el cinturón de seguridad cuando conduzca.  · Haga una lista de los números de teléfono de  emergencia, que incluya los números de teléfono de familiares, amigos, el hospital y los departamentos de policía y bomberos.  Consejos generales  · Pídale al médico que la derive a clases de educación prenatal en su localidad. Debe comenzar a tomar las clases antes de entrar en el mes 6 de embarazo.  · Pida ayuda si tiene necesidades nutricionales o de asesoramiento durante el embarazo. El médico puede aconsejarla o derivarla a especialistas para que la ayuden con diferentes necesidades.  · No se dé baños de inmersión en agua caliente, baños turcos ni saunas.  · No se haga duchas vaginales ni use tampones o toallas higiénicas perfumadas.  · No mantenga las piernas cruzadas durante mucho tiempo.  · Evite el contacto con las bandejas sanitarias de los gatos y la tierra que estos animales usan. Estos elementos contienen bacterias que pueden causar defectos congénitos al bebé y la posible pérdida del feto debido a un aborto espontáneo o muerte fetal.  · No fume, no consuma hierbas ni medicamentos que no hayan sido recetados por el médico. Las sustancias químicas que estos productos contienen afectan la formación y el desarrollo del bebé.  · No consuma ningún producto que contenga tabaco, lo que incluye cigarrillos, tabaco de mascar y cigarrillos electrónicos. Si necesita ayuda para dejar de fumar, consulte al médico. Puede recibir asesoramiento y otro tipo de recursos para dejar de fumar.  · Programe una cita con el dentista. En su casa, lávese los dientes con un cepillo dental blando y pásese el hilo dental con suavidad.  SOLICITE ATENCIÓN MÉDICA SI:   · Tiene mareos.  · Siente cólicos leves, presión en la pelvis o dolor persistente en el abdomen.  · Tiene náuseas, vómitos o diarrea persistentes.  · Tiene secreción vaginal con mal olor.  · Siente dolor al orinar.  · Tiene el rostro, las manos, las piernas o los tobillos más hinchados.  SOLICITE ATENCIÓN MÉDICA DE INMEDIATO SI:   · Tiene fiebre.  · Tiene una pérdida de  líquido por la vagina.  · Tiene sangrado o pequeñas pérdidas vaginales.  · Siente dolor intenso o cólicos en el abdomen.  · Sube o baja de peso rápidamente.  · Vomita sangre de color rojo brillante o material que parezca granos de café.  · Ha estado expuesta a la rubéola y no ha sufrido la enfermedad.  · Ha estado expuesta a la quinta enfermedad o a la varicela.  · Tiene un dolor de cabeza intenso.  · Le falta el aire.  · Sufre   cualquier tipo de traumatismo, por ejemplo, debido a una caída o un accidente automovilístico.     Esta información no tiene como fin reemplazar el consejo del médico. Asegúrese de hacerle al médico cualquier pregunta que tenga.     Document Released: 11/01/2004 Document Revised: 02/12/2014  Elsevier Interactive Patient Education ©2016 [employer]

## 2015-03-15 LAB — GC/CHLAMYDIA PROBE AMP (~~LOC~~) NOT AT ARMC
CHLAMYDIA, DNA PROBE: NEGATIVE
NEISSERIA GONORRHEA: NEGATIVE

## 2015-03-15 LAB — OBSTETRIC PANEL
Antibody Screen: NEGATIVE
Basophils Absolute: 0 10*3/uL (ref 0.0–0.1)
Basophils Relative: 0 % (ref 0–1)
EOS ABS: 0.2 10*3/uL (ref 0.0–0.7)
EOS PCT: 2 % (ref 0–5)
HEMATOCRIT: 38.3 % (ref 36.0–46.0)
HEP B S AG: NEGATIVE
Hemoglobin: 13.1 g/dL (ref 12.0–15.0)
LYMPHS ABS: 2.5 10*3/uL (ref 0.7–4.0)
Lymphocytes Relative: 29 % (ref 12–46)
MCH: 31.6 pg (ref 26.0–34.0)
MCHC: 34.2 g/dL (ref 30.0–36.0)
MCV: 92.5 fL (ref 78.0–100.0)
MONOS PCT: 6 % (ref 3–12)
MPV: 10.4 fL (ref 8.6–12.4)
Monocytes Absolute: 0.5 10*3/uL (ref 0.1–1.0)
Neutro Abs: 5.4 10*3/uL (ref 1.7–7.7)
Neutrophils Relative %: 63 % (ref 43–77)
PLATELETS: 229 10*3/uL (ref 150–400)
RBC: 4.14 MIL/uL (ref 3.87–5.11)
RDW: 13.4 % (ref 11.5–15.5)
RH TYPE: POSITIVE
Rubella: 8.75 Index — ABNORMAL HIGH (ref <0.90)
WBC: 8.5 10*3/uL (ref 4.0–10.5)

## 2015-03-15 LAB — PROTEIN / CREATININE RATIO, URINE
Creatinine, Urine: 62 mg/dL (ref 20–320)
Protein Creatinine Ratio: 210 mg/g creat — ABNORMAL HIGH (ref 21–161)
Total Protein, Urine: 13 mg/dL (ref 5–24)

## 2015-03-16 LAB — HEMOGLOBINOPATHY EVALUATION
HEMOGLOBIN OTHER: 0 %
HGB A2 QUANT: 2.9 % (ref 2.2–3.2)
HGB F QUANT: 0.6 % (ref 0.0–2.0)
Hgb A: 96.5 % — ABNORMAL LOW (ref 96.8–97.8)
Hgb S Quant: 0 %

## 2015-03-21 ENCOUNTER — Ambulatory Visit: Payer: Self-pay

## 2015-03-28 ENCOUNTER — Encounter: Payer: Self-pay | Admitting: Family Medicine

## 2015-04-04 ENCOUNTER — Ambulatory Visit (INDEPENDENT_AMBULATORY_CARE_PROVIDER_SITE_OTHER): Payer: Self-pay | Admitting: Obstetrics & Gynecology

## 2015-04-04 ENCOUNTER — Encounter: Payer: Self-pay | Admitting: Obstetrics & Gynecology

## 2015-04-04 VITALS — BP 110/56 | HR 85 | Temp 98.3°F | Wt 181.6 lb

## 2015-04-04 DIAGNOSIS — O24919 Unspecified diabetes mellitus in pregnancy, unspecified trimester: Secondary | ICD-10-CM

## 2015-04-04 DIAGNOSIS — O24912 Unspecified diabetes mellitus in pregnancy, second trimester: Secondary | ICD-10-CM

## 2015-04-04 LAB — POCT URINALYSIS DIP (DEVICE)
Bilirubin Urine: NEGATIVE
Glucose, UA: 500 mg/dL — AB
HGB URINE DIPSTICK: NEGATIVE
Ketones, ur: NEGATIVE mg/dL
LEUKOCYTES UA: NEGATIVE
NITRITE: NEGATIVE
PROTEIN: NEGATIVE mg/dL
Specific Gravity, Urine: 1.025 (ref 1.005–1.030)
UROBILINOGEN UA: 0.2 mg/dL (ref 0.0–1.0)
pH: 5.5 (ref 5.0–8.0)

## 2015-04-04 MED ORDER — INSULIN NPH (HUMAN) (ISOPHANE) 100 UNIT/ML ~~LOC~~ SUSP: SUBCUTANEOUS | Status: DC

## 2015-04-04 NOTE — Patient Instructions (Signed)

## 2015-04-04 NOTE — Progress Notes (Signed)
Subjective:can't come in 2 weeks for appt due to fear of deportation  Lindsay Morrison is a 30 y.o. G3P2002 at [redacted]w[redacted]d being seen today for ongoing prenatal care.  She is currently monitored for the following issues for this high-risk pregnancy and has Diabetes mellitus affecting pregnancy, antepartum; Hypertension; Previous cesarean delivery, delivered; Supervision of other high-risk pregnancy; Hypertension affecting pregnancy; Obesity affecting pregnancy, antepartum; and History of macrosomia in infant in prior pregnancy, currently pregnant on her problem list.  Patient reports no complaints.  Contractions: Not present. Vag. Bleeding: None.  Movement: Absent. Denies leaking of fluid.   The following portions of the patient's history were reviewed and updated as appropriate: allergies, current medications, past family history, past medical history, past social history, past surgical history and problem list. Problem list updated.  Objective:   Filed Vitals:   04/04/15 0911  BP: 110/56  Pulse: 85  Temp: 98.3 F (36.8 C)  Weight: 181 lb 9.6 oz (82.373 kg)    Fetal Status: Fetal Heart Rate (bpm): 140   Movement: Absent     General:  Alert, oriented and cooperative. Patient is in no acute distress.  Skin: Skin is warm and dry. No rash noted.   Cardiovascular: Normal heart rate noted  Respiratory: Normal respiratory effort, no problems with respiration noted  Abdomen: Soft, gravid, appropriate for gestational age. Pain/Pressure: Absent     Pelvic: Vag. Bleeding: None     Cervical exam deferred        Extremities: Normal range of motion.  Edema: None  Mental Status: Normal mood and affect. Normal behavior. Normal judgment and thought content.   Urinalysis: Urine Protein: Negative Urine Glucose: 3+  Assessment and Plan:  Pregnancy: G3P2002 at [redacted]w[redacted]d  1. Diabetes mellitus affecting pregnancy, antepartum PP BG up to 150 after breakfast and dinner Increase to 12 units [place] in AM Preterm labor  symptoms and general obstetric precautions including but not limited to vaginal bleeding, contractions, leaking of fluid and fetal movement were reviewed in detail with the patient. Please refer to After Visit Summary for other counseling recommendations.  Return in about 2 weeks (around 04/18/2015).4 weeks if not possible in 2   Adam Phenix, MD

## 2015-04-04 NOTE — Progress Notes (Signed)
Patient does not intend to breastfeed. 

## 2015-05-02 ENCOUNTER — Other Ambulatory Visit: Payer: Self-pay | Admitting: Family Medicine

## 2015-05-02 ENCOUNTER — Encounter: Payer: Self-pay | Admitting: Obstetrics and Gynecology

## 2015-05-02 ENCOUNTER — Ambulatory Visit (INDEPENDENT_AMBULATORY_CARE_PROVIDER_SITE_OTHER): Payer: Self-pay | Admitting: Obstetrics and Gynecology

## 2015-05-02 ENCOUNTER — Ambulatory Visit (HOSPITAL_COMMUNITY)
Admission: RE | Admit: 2015-05-02 | Discharge: 2015-05-02 | Disposition: A | Discharge status: Home / Self Care | Payer: Self-pay | Source: Ambulatory Visit | Attending: Family Medicine | Admitting: Family Medicine

## 2015-05-02 VITALS — BP 116/67 | HR 83 | Temp 98.3°F | Wt 181.6 lb

## 2015-05-02 DIAGNOSIS — O10012 Pre-existing essential hypertension complicating pregnancy, second trimester: Secondary | ICD-10-CM | POA: Insufficient documentation

## 2015-05-02 DIAGNOSIS — O09891 Supervision of other high risk pregnancies, first trimester: Secondary | ICD-10-CM

## 2015-05-02 DIAGNOSIS — O162 Unspecified maternal hypertension, second trimester: Secondary | ICD-10-CM

## 2015-05-02 DIAGNOSIS — Z36 Encounter for antenatal screening of mother: Secondary | ICD-10-CM | POA: Insufficient documentation

## 2015-05-02 DIAGNOSIS — O34219 Maternal care for unspecified type scar from previous cesarean delivery: Secondary | ICD-10-CM

## 2015-05-02 DIAGNOSIS — O24919 Unspecified diabetes mellitus in pregnancy, unspecified trimester: Secondary | ICD-10-CM

## 2015-05-02 DIAGNOSIS — O99212 Obesity complicating pregnancy, second trimester: Secondary | ICD-10-CM | POA: Insufficient documentation

## 2015-05-02 DIAGNOSIS — Z3A19 19 weeks gestation of pregnancy: Secondary | ICD-10-CM | POA: Insufficient documentation

## 2015-05-02 DIAGNOSIS — I10 Essential (primary) hypertension: Secondary | ICD-10-CM

## 2015-05-02 DIAGNOSIS — O09892 Supervision of other high risk pregnancies, second trimester: Secondary | ICD-10-CM

## 2015-05-02 DIAGNOSIS — O09292 Supervision of pregnancy with other poor reproductive or obstetric history, second trimester: Secondary | ICD-10-CM

## 2015-05-02 DIAGNOSIS — O24414 Gestational diabetes mellitus in pregnancy, insulin controlled: Secondary | ICD-10-CM | POA: Insufficient documentation

## 2015-05-02 LAB — POCT URINALYSIS DIP (DEVICE)
BILIRUBIN URINE: NEGATIVE
Glucose, UA: 100 mg/dL — AB
HGB URINE DIPSTICK: NEGATIVE
KETONES UR: NEGATIVE mg/dL
Leukocytes, UA: NEGATIVE
Nitrite: NEGATIVE
PH: 6.5 (ref 5.0–8.0)
PROTEIN: NEGATIVE mg/dL
SPECIFIC GRAVITY, URINE: 1.025 (ref 1.005–1.030)
Urobilinogen, UA: 1 mg/dL (ref 0.0–1.0)

## 2015-05-02 NOTE — Progress Notes (Signed)
Fetal echo scheduled for 04/26 @ 9am

## 2015-05-02 NOTE — Progress Notes (Signed)
Subjective:  Lindsay Morrison is a 30 y.o. G3P2002 at 6779w3d being seen today for ongoing prenatal care.  She is currently monitored for the following issues for this high-risk pregnancy and has Diabetes mellitus affecting pregnancy, antepartum; Hypertension; Previous cesarean delivery, delivered; Supervision of other high-risk pregnancy; Hypertension affecting pregnancy; Obesity affecting pregnancy, antepartum; and History of macrosomia in infant in prior pregnancy, currently pregnant on her problem list.  Patient reports no complaints.  Contractions: Not present. Vag. Bleeding: None.  Movement: Present. Denies leaking of fluid.   The following portions of the patient's history were reviewed and updated as appropriate: allergies, current medications, past family history, past medical history, past social history, past surgical history and problem list. Problem list updated.  Objective:   Filed Vitals:   05/02/15 1002  BP: 116/67  Pulse: 83  Temp: 98.3 F (36.8 C)  Weight: 181 lb 9.6 oz (82.373 kg)    Fetal Status: Fetal Heart Rate (bpm): 145   Movement: Present     General:  Alert, oriented and cooperative. Patient is in no acute distress.  Skin: Skin is warm and dry. No rash noted.   Cardiovascular: Normal heart rate noted  Respiratory: Normal respiratory effort, no problems with respiration noted  Abdomen: Soft, gravid, appropriate for gestational age. Pain/Pressure: Absent     Pelvic: Vag. Bleeding: None     Cervical exam deferred        Extremities: Normal range of motion.  Edema: None  Mental Status: Normal mood and affect. Normal behavior. Normal judgment and thought content.   Urinalysis:      Assessment and Plan:  Pregnancy: G3P2002 at 8979w3d  1. Previous cesarean delivery, delivered Desires repeat with abdominal plasty  2. Supervision of other high-risk pregnancy, second trimester   3. Essential hypertension   4. Hypertension affecting pregnancy, second  trimester   5. History of macrosomia in infant in prior pregnancy, currently pregnant, second trimester   6. Diabetes mellitus affecting pregnancy, antepartum CBGs reviewed. All values out of range and patient not checking 2 hours after meals and often eats at 1-2 am  Fasting as high as 110 and pp as high as 190 Discussed timing of her meals with snacks in between. Also discussed incorporating exercise 30 minutes daily Patient is not able to return next week to review CBG and adjust insulin if indicated. Will call patient to review CBGs over the phone Fetal echo to be scheduled today Anatomy ultrasound report not yet available to be reviewed  General obstetric precautions including but not limited to vaginal bleeding, contractions, leaking of fluid and fetal movement were reviewed in detail with the patient. Please refer to After Visit Summary for other counseling recommendations.  Return in about 3 weeks (around 05/23/2015).   Catalina AntiguaPeggy Antwan Pandya, MD

## 2015-05-16 ENCOUNTER — Telehealth: Payer: Self-pay | Admitting: General Practice

## 2015-05-16 NOTE — Telephone Encounter (Signed)
Per Dr Jolayne Pantheronstant, patient is unable to return to clinic for blood sugar review. Needs to be called to review CBGs to make sure patient does not need adjustments to her insulin. Called patient with Genella RifeSilvia for interpreter, no answer- left message to call us back at the clinics

## 2015-05-18 NOTE — Telephone Encounter (Signed)
Pt scheduled for appointment tomorrow. Note made to review cbg at that time.

## 2015-05-19 ENCOUNTER — Encounter: Payer: Self-pay | Admitting: Family Medicine

## 2015-05-19 ENCOUNTER — Ambulatory Visit (INDEPENDENT_AMBULATORY_CARE_PROVIDER_SITE_OTHER): Payer: Self-pay | Admitting: Family Medicine

## 2015-05-19 VITALS — BP 109/66 | HR 86 | Wt 183.3 lb

## 2015-05-19 DIAGNOSIS — O162 Unspecified maternal hypertension, second trimester: Secondary | ICD-10-CM

## 2015-05-19 DIAGNOSIS — O09892 Supervision of other high risk pregnancies, second trimester: Secondary | ICD-10-CM

## 2015-05-19 DIAGNOSIS — O24912 Unspecified diabetes mellitus in pregnancy, second trimester: Secondary | ICD-10-CM

## 2015-05-19 DIAGNOSIS — O24919 Unspecified diabetes mellitus in pregnancy, unspecified trimester: Secondary | ICD-10-CM

## 2015-05-19 LAB — POCT URINALYSIS DIP (DEVICE)
Bilirubin Urine: NEGATIVE
Glucose, UA: NEGATIVE mg/dL
HGB URINE DIPSTICK: NEGATIVE
KETONES UR: NEGATIVE mg/dL
Leukocytes, UA: NEGATIVE
Nitrite: NEGATIVE
PH: 6.5 (ref 5.0–8.0)
PROTEIN: NEGATIVE mg/dL
Specific Gravity, Urine: 1.025 (ref 1.005–1.030)
Urobilinogen, UA: 1 mg/dL (ref 0.0–1.0)

## 2015-05-19 MED ORDER — INSULIN REGULAR HUMAN 100 UNIT/ML IJ SOLN: 11.0000 [IU] | Freq: Two times a day (BID) | INTRAMUSCULAR | Status: DC

## 2015-05-19 MED ORDER — INSULIN NPH (HUMAN) (ISOPHANE) 100 UNIT/ML ~~LOC~~ SUSP: SUBCUTANEOUS | Status: DC

## 2015-05-19 NOTE — Patient Instructions (Addendum)
NPH (novolin): 28 unidades antes de desayuno y 11 unidades antes de acostarse  Humulin R: 14 unidades antes de desayuno y 11 unidades antes de Higher education careers advisercenar

## 2015-05-19 NOTE — Progress Notes (Signed)
Subjective:  Lindsay Morrison is a 30 y.o. G3P2002 at 7070w6d being seen today for ongoing prenatal care.  She is currently monitored for the following issues for this high-risk pregnancy and has Diabetes mellitus affecting pregnancy, antepartum; Hypertension; Previous cesarean delivery, delivered; Supervision of other high-risk pregnancy; Hypertension affecting pregnancy; Obesity affecting pregnancy, antepartum; and History of macrosomia in infant in prior pregnancy, currently pregnant on her problem list.  Patient reports no complaints.  Contractions: Not present. Vag. Bleeding: None.  Movement: Present. Denies leaking of fluid.   Type 2 DM: Patient taking NPH.  Reports no hypoglycemic episodes.  Tolerating medication well.  Patient has been taking blood sugars fasting, before dinner and two hours after breakfast and dinner.  No testing after lunch Fasting:120 - 160 2hr PP: 99-228   The following portions of the patient's history were reviewed and updated as appropriate: allergies, current medications, past family history, past medical history, past social history, past surgical history and problem list. Problem list updated.  Objective:   Filed Vitals:   05/19/15 0900  BP: 109/66  Pulse: 86  Weight: 183 lb 4.8 oz (83.144 kg)    Fetal Status: Fetal Heart Rate (bpm): 145   Movement: Present     General:  Alert, oriented and cooperative. Patient is in no acute distress.  Skin: Skin is warm and dry. No rash noted.   Cardiovascular: Normal heart rate noted  Respiratory: Normal respiratory effort, no problems with respiration noted  Abdomen: Soft, gravid, appropriate for gestational age. Pain/Pressure: Absent     Pelvic: Vag. Bleeding: None     Cervical exam deferred        Extremities: Normal range of motion.  Edema: None  Mental Status: Normal mood and affect. Normal behavior. Normal judgment and thought content.   Urinalysis:      Assessment and Plan:  Pregnancy: G3P2002 at 5670w6d  1.  Supervision of other high-risk pregnancy, second trimester FHT normal.    2. Diabetes mellitus affecting pregnancy, antepartum Will start weight based insulin: NPH 28 u in AM and 11 u before bed; Humulin R 14 u before breakfast and 11 u before dinner. Has fetal echo on 4/26  3. Hypertension affecting pregnancy, second trimester controlled  Preterm labor symptoms and general obstetric precautions including but not limited to vaginal bleeding, contractions, leaking of fluid and fetal movement were reviewed in detail with the patient. Please refer to After Visit Summary for other counseling recommendations.  No Follow-up on file.   Levie HeritageJacob J Stinson, DO

## 2015-05-23 ENCOUNTER — Other Ambulatory Visit: Payer: Self-pay | Admitting: General Practice

## 2015-05-23 ENCOUNTER — Telehealth: Payer: Self-pay

## 2015-05-23 DIAGNOSIS — O24912 Unspecified diabetes mellitus in pregnancy, second trimester: Secondary | ICD-10-CM

## 2015-05-23 MED ORDER — INSULIN REGULAR HUMAN 100 UNIT/ML IJ SOLN: INTRAMUSCULAR | Status: DC

## 2015-05-23 NOTE — Telephone Encounter (Signed)
Pt called the front desk and stated that a nurse told her to call on Monday with CBG results.  Looking into pt's chart, Dr. Adrian BlackwaterStinson prescribed new orders for insulin, NPH/Humulin R.  After speaking with pt with Delphina CahillSusan Ross the pt stated that she has Rx 70/30 and Novolin R.  I advised pt that she does not have the correct insulin medications and that I would call her pharmacy to clarify.  [facility] and spoke with [place].  Tonya informed me that pt picked up 70/30 medication which was an Rx from over a year ago and that she does see the orders for NPH and Humulin R in which she will fill.  Notified Dr. Jolayne Pantheronstant that pt was to inform us of CBG readings from the start of new insulin but has not been taking it as prescribed due to misunderstanding from pt's pharmacy.  Recommendation for pt to start insulin medication as prescribed and she will be evaluated at her appt scheduled in two weeks on 06/06/15.  Called pt with Spanish interpreter Dori and LM x 2 to pick up her correct prescriptions and to speak with Archie Pattenonya at her pharmacy.  If she has any questions to please give the Clinics a call.

## 2015-06-06 ENCOUNTER — Encounter: Payer: Self-pay | Admitting: Obstetrics and Gynecology

## 2015-06-06 ENCOUNTER — Ambulatory Visit (INDEPENDENT_AMBULATORY_CARE_PROVIDER_SITE_OTHER): Payer: Self-pay | Admitting: Obstetrics and Gynecology

## 2015-06-06 VITALS — BP 114/56 | HR 83 | Wt 186.7 lb

## 2015-06-06 DIAGNOSIS — O34219 Maternal care for unspecified type scar from previous cesarean delivery: Secondary | ICD-10-CM

## 2015-06-06 DIAGNOSIS — I10 Essential (primary) hypertension: Secondary | ICD-10-CM

## 2015-06-06 DIAGNOSIS — E669 Obesity, unspecified: Secondary | ICD-10-CM

## 2015-06-06 DIAGNOSIS — O09892 Supervision of other high risk pregnancies, second trimester: Secondary | ICD-10-CM

## 2015-06-06 DIAGNOSIS — O162 Unspecified maternal hypertension, second trimester: Secondary | ICD-10-CM

## 2015-06-06 DIAGNOSIS — O24919 Unspecified diabetes mellitus in pregnancy, unspecified trimester: Secondary | ICD-10-CM

## 2015-06-06 DIAGNOSIS — O09292 Supervision of pregnancy with other poor reproductive or obstetric history, second trimester: Secondary | ICD-10-CM

## 2015-06-06 DIAGNOSIS — O24913 Unspecified diabetes mellitus in pregnancy, third trimester: Secondary | ICD-10-CM

## 2015-06-06 DIAGNOSIS — O99212 Obesity complicating pregnancy, second trimester: Secondary | ICD-10-CM

## 2015-06-06 LAB — POCT URINALYSIS DIP (DEVICE)
BILIRUBIN URINE: NEGATIVE
GLUCOSE, UA: NEGATIVE mg/dL
Hgb urine dipstick: NEGATIVE
KETONES UR: NEGATIVE mg/dL
Leukocytes, UA: NEGATIVE
NITRITE: NEGATIVE
PROTEIN: NEGATIVE mg/dL
Specific Gravity, Urine: 1.02 (ref 1.005–1.030)
Urobilinogen, UA: 0.2 mg/dL (ref 0.0–1.0)
pH: 7 (ref 5.0–8.0)

## 2015-06-06 NOTE — Progress Notes (Signed)
Subjective:  Lindsay Morrison is a 30 y.o. G3P2002 at 4718w3d being seen today for ongoing prenatal care.  She is currently monitored for the following issues for this high-risk pregnancy and has Diabetes mellitus affecting pregnancy, antepartum; Hypertension; Previous cesarean delivery, delivered; Supervision of other high-risk pregnancy; Hypertension affecting pregnancy; Obesity affecting pregnancy, antepartum; and History of macrosomia in infant in prior pregnancy, currently pregnant on her problem list.  Patient reports no complaints.  Contractions: Not present. Vag. Bleeding: None.  Movement: Present. Denies leaking of fluid.   The following portions of the patient's history were reviewed and updated as appropriate: allergies, current medications, past family history, past medical history, past social history, past surgical history and problem list. Problem list updated.  Objective:   Filed Vitals:   06/06/15 0813  BP: 114/56  Pulse: 83  Weight: 186 lb 11.2 oz (84.687 kg)    Fetal Status: Fetal Heart Rate (bpm): 148 Fundal Height: 25 cm Movement: Present     General:  Alert, oriented and cooperative. Patient is in no acute distress.  Skin: Skin is warm and dry. No rash noted.   Cardiovascular: Normal heart rate noted  Respiratory: Normal respiratory effort, no problems with respiration noted  Abdomen: Soft, gravid, appropriate for gestational age. Pain/Pressure: Absent     Pelvic: Vag. Bleeding: None     Cervical exam deferred        Extremities: Normal range of motion.  Edema: None  Mental Status: Normal mood and affect. Normal behavior. Normal judgment and thought content.   Urinalysis:      Assessment and Plan:  Pregnancy: G3P2002 at 2618w3d  1. Previous cesarean delivery, delivered Will be scheduled for repeat at 39 weeks Patient desires abdominal plasty at the time of c-section. Will try to coordinate c-section with Dr. Emelda FearFerguson but patient understands that there is no  garantee  2. Supervision of other high-risk pregnancy, second trimester Patient is doing well without complaints  3. Obesity affecting pregnancy, antepartum, second trimester   4. Essential hypertension Well controlled without medication  5. Hypertension affecting pregnancy, second trimester   6. History of macrosomia in infant in prior pregnancy, currently pregnant, second trimester   7. Diabetes mellitus affecting pregnancy, antepartum CBGs reviewed and majority out of range: fasting 84-105  pB 120-157  p L133-180 pD 125-170 Will increase NPH 30 in am and 13 at bedtime  Preterm labor symptoms and general obstetric precautions including but not limited to vaginal bleeding, contractions, leaking of fluid and fetal movement were reviewed in detail with the patient. Please refer to After Visit Summary for other counseling recommendations.  Patient is unable to return in 2 weeks. She will call in 2 weeks to discuss results of her CBGs on new insulin dosage  No Follow-up on file.   Catalina AntiguaPeggy Elleanor Guyett, MD

## 2015-06-27 ENCOUNTER — Ambulatory Visit (INDEPENDENT_AMBULATORY_CARE_PROVIDER_SITE_OTHER): Payer: Self-pay | Admitting: Obstetrics & Gynecology

## 2015-06-27 VITALS — BP 117/59 | HR 82 | Wt 188.0 lb

## 2015-06-27 DIAGNOSIS — O24912 Unspecified diabetes mellitus in pregnancy, second trimester: Secondary | ICD-10-CM

## 2015-06-27 DIAGNOSIS — O09892 Supervision of other high risk pregnancies, second trimester: Secondary | ICD-10-CM

## 2015-06-27 DIAGNOSIS — O24919 Unspecified diabetes mellitus in pregnancy, unspecified trimester: Secondary | ICD-10-CM

## 2015-06-27 DIAGNOSIS — O162 Unspecified maternal hypertension, second trimester: Secondary | ICD-10-CM

## 2015-06-27 DIAGNOSIS — O34219 Maternal care for unspecified type scar from previous cesarean delivery: Secondary | ICD-10-CM

## 2015-06-27 DIAGNOSIS — Z23 Encounter for immunization: Secondary | ICD-10-CM

## 2015-06-27 LAB — POCT URINALYSIS DIP (DEVICE)
BILIRUBIN URINE: NEGATIVE
Glucose, UA: NEGATIVE mg/dL
HGB URINE DIPSTICK: NEGATIVE
Ketones, ur: NEGATIVE mg/dL
LEUKOCYTES UA: NEGATIVE
NITRITE: NEGATIVE
Protein, ur: NEGATIVE mg/dL
SPECIFIC GRAVITY, URINE: 1.01 (ref 1.005–1.030)
Urobilinogen, UA: 0.2 mg/dL (ref 0.0–1.0)
pH: 6 (ref 5.0–8.0)

## 2015-06-27 MED ORDER — INSULIN REGULAR HUMAN 100 UNIT/ML IJ SOLN: INTRAMUSCULAR | Status: DC

## 2015-06-27 MED ORDER — TETANUS-DIPHTH-ACELL PERTUSSIS 5-2.5-18.5 LF-MCG/0.5 IM SUSP
0.5000 mL | Freq: Once | INTRAMUSCULAR | Status: AC
  Administered 2015-06-27: 0.5 mL via INTRAMUSCULAR

## 2015-06-27 MED ORDER — INSULIN NPH (HUMAN) (ISOPHANE) 100 UNIT/ML ~~LOC~~ SUSP: SUBCUTANEOUS | Status: DC

## 2015-06-27 NOTE — Progress Notes (Signed)
Subjective:  Lindsay Morrison is a 30 y.o. G3P2002 at 6728w3d being seen today for ongoing prenatal care.  She is currently monitored for the following issues for this high-risk pregnancy and has Diabetes mellitus affecting pregnancy, antepartum; Hypertension; Previous cesarean delivery, delivered; Supervision of other high-risk pregnancy; Hypertension affecting pregnancy; Obesity affecting pregnancy, antepartum; and History of macrosomia in infant in prior pregnancy, currently pregnant on her problem list.  Patient reports no complaints.  Contractions: Not present. Vag. Bleeding: None.  Movement: Present. Denies leaking of fluid.   The following portions of the patient's history were reviewed and updated as appropriate: allergies, current medications, past family history, past medical history, past social history, past surgical history and problem list. Problem list updated.  Objective:   Filed Vitals:   06/27/15 0851  BP: 117/59  Pulse: 82  Weight: 188 lb (85.276 kg)    Fetal Status: Fetal Heart Rate (bpm): 152   Movement: Present     General:  Alert, oriented and cooperative. Patient is in no acute distress.  Skin: Skin is warm and dry. No rash noted.   Cardiovascular: Normal heart rate noted  Respiratory: Normal respiratory effort, no problems with respiration noted  Abdomen: Soft, gravid, appropriate for gestational age. Pain/Pressure: Absent     Pelvic: Vag. Bleeding: None     Cervical exam deferred        Extremities: Normal range of motion.  Edema: None  Mental Status: Normal mood and affect. Normal behavior. Normal judgment and thought content.   Urinalysis: Urine Protein: Negative Urine Glucose: Negative  Assessment and Plan:  Pregnancy: G3P2002 at 1828w3d  1. Diabetes mellitus affecting pregnancy, antepartum -all values slightly elevated.  All insulin doses increased 10%.  Diabetic educator to call in one week.  Pt to return in 2 week (although patient said she may not return  for 3 weeks.  I did not advise her to go that long between prenatal appts). - CBC - RPR - HIV antibody (with reflex) - insulin NPH Human (HUMULIN N,NOVOLIN N) 100 UNIT/ML injection; 32 units [place] before breakfast and 13 units [place] before bedtime  Dispense: 10 mL; Refill: 3 - US MFM OB FOLLOW UP; Future--growth and f/u anatomy - Ambulatory referral to Ophthalmology  2. Need for Tdap vaccination - Tdap (BOOSTRIX) injection 0.5 mL; Inject 0.5 mLs into the muscle once.  3. Supervision of other high-risk pregnancy, second trimester -wants BTL at time of rpt c/s  4. Hypertension affecting pregnancy, second trimester -BP nml, Pt has not started baby ASA.  Pt encouraged to do so.  Preterm labor symptoms and general obstetric precautions including but not limited to vaginal bleeding, contractions, leaking of fluid and fetal movement were reviewed in detail with the patient. Please refer to After Visit Summary for other counseling recommendations.  Return in about 2 weeks (around 07/11/2015).   Lindsay DukesKelly H Leggett, MD

## 2015-06-27 NOTE — Progress Notes (Signed)
tdap given today. Needs cbc rpr hiv drawn.

## 2015-06-27 NOTE — Progress Notes (Signed)
U/S scheduled 06/02 @ 745. Eye exam scheduled 06/02 @ 11am with [facility]. Fetal echo results to be faxed today from Dr Elizebeth Brookingotton.

## 2015-06-28 LAB — CBC
HEMATOCRIT: 36.3 % (ref 35.0–45.0)
Hemoglobin: 12.4 g/dL (ref 11.7–15.5)
MCH: 32 pg (ref 27.0–33.0)
MCHC: 34.2 g/dL (ref 32.0–36.0)
MCV: 93.8 fL (ref 80.0–100.0)
MPV: 10.8 fL (ref 7.5–12.5)
Platelets: 241 10*3/uL (ref 140–400)
RBC: 3.87 MIL/uL (ref 3.80–5.10)
RDW: 13.8 % (ref 11.0–15.0)
WBC: 9.2 10*3/uL (ref 3.8–10.8)

## 2015-06-28 LAB — RPR

## 2015-06-28 LAB — HIV ANTIBODY (ROUTINE TESTING W REFLEX): HIV: NONREACTIVE

## 2015-06-30 ENCOUNTER — Telehealth: Payer: Self-pay | Admitting: General Practice

## 2015-06-30 NOTE — Telephone Encounter (Signed)
Patient needs to be contacted 5/30 to inquire about CBGs levels. Please forward numbers to Dr Penne LashLeggett

## 2015-07-07 NOTE — Telephone Encounter (Signed)
Called patient to inquire about CBG values since last visit. Patient reports the following: 5/22 129 152 180 214 5/23 143 157 160 180 5/24 109 160 187 143 5/25 111 130 140 169 5/26 108 154 148 128 5/27 106 150 208 104 5/28 101 140 165 180 5/29 120 130 140 100 5/30 91 120 144 152 5/31 81 100 129 136 6/1 86 Patient states she is taking the correct insulin doses. Will forward to Dr Penne LashLeggett.

## 2015-07-08 ENCOUNTER — Ambulatory Visit (HOSPITAL_COMMUNITY)
Admission: RE | Admit: 2015-07-08 | Discharge: 2015-07-08 | Disposition: A | Discharge status: Home / Self Care | Payer: Self-pay | Source: Ambulatory Visit | Attending: Obstetrics & Gynecology | Admitting: Obstetrics & Gynecology

## 2015-07-08 ENCOUNTER — Other Ambulatory Visit: Payer: Self-pay | Admitting: Obstetrics & Gynecology

## 2015-07-08 ENCOUNTER — Encounter (HOSPITAL_COMMUNITY): Payer: Self-pay

## 2015-07-08 VITALS — BP 115/56 | HR 84 | Wt 190.2 lb

## 2015-07-08 DIAGNOSIS — Z3A29 29 weeks gestation of pregnancy: Secondary | ICD-10-CM

## 2015-07-08 DIAGNOSIS — O09293 Supervision of pregnancy with other poor reproductive or obstetric history, third trimester: Secondary | ICD-10-CM

## 2015-07-08 DIAGNOSIS — O169 Unspecified maternal hypertension, unspecified trimester: Secondary | ICD-10-CM

## 2015-07-08 DIAGNOSIS — O24919 Unspecified diabetes mellitus in pregnancy, unspecified trimester: Secondary | ICD-10-CM

## 2015-07-08 DIAGNOSIS — O10919 Unspecified pre-existing hypertension complicating pregnancy, unspecified trimester: Secondary | ICD-10-CM

## 2015-07-08 DIAGNOSIS — O24414 Gestational diabetes mellitus in pregnancy, insulin controlled: Secondary | ICD-10-CM

## 2015-07-08 DIAGNOSIS — O10013 Pre-existing essential hypertension complicating pregnancy, third trimester: Secondary | ICD-10-CM | POA: Insufficient documentation

## 2015-07-08 DIAGNOSIS — O99210 Obesity complicating pregnancy, unspecified trimester: Secondary | ICD-10-CM

## 2015-07-10 NOTE — Telephone Encounter (Signed)
Last 3 days show better control, especially the fasting value.  Will continue with current regimen and see patient next Monday.  Please call patient and let her know to call us if her CBGs increase to abnormal levels.

## 2015-07-13 NOTE — Telephone Encounter (Signed)
Called patient with Lindsay Morrison for interpreter and informed patient of satisfactory levels. Patient verbalized understanding & had no questions

## 2015-07-18 ENCOUNTER — Ambulatory Visit (INDEPENDENT_AMBULATORY_CARE_PROVIDER_SITE_OTHER): Payer: Self-pay | Admitting: Family Medicine

## 2015-07-18 VITALS — BP 114/59 | HR 97 | Wt 190.1 lb

## 2015-07-18 DIAGNOSIS — O09293 Supervision of pregnancy with other poor reproductive or obstetric history, third trimester: Secondary | ICD-10-CM

## 2015-07-18 DIAGNOSIS — O34219 Maternal care for unspecified type scar from previous cesarean delivery: Secondary | ICD-10-CM

## 2015-07-18 DIAGNOSIS — O24312 Unspecified pre-existing diabetes mellitus in pregnancy, second trimester: Secondary | ICD-10-CM

## 2015-07-18 DIAGNOSIS — O24912 Unspecified diabetes mellitus in pregnancy, second trimester: Secondary | ICD-10-CM

## 2015-07-18 DIAGNOSIS — O163 Unspecified maternal hypertension, third trimester: Secondary | ICD-10-CM

## 2015-07-18 DIAGNOSIS — E669 Obesity, unspecified: Secondary | ICD-10-CM

## 2015-07-18 DIAGNOSIS — O24919 Unspecified diabetes mellitus in pregnancy, unspecified trimester: Secondary | ICD-10-CM

## 2015-07-18 DIAGNOSIS — O99213 Obesity complicating pregnancy, third trimester: Secondary | ICD-10-CM

## 2015-07-18 DIAGNOSIS — O09893 Supervision of other high risk pregnancies, third trimester: Secondary | ICD-10-CM

## 2015-07-18 LAB — POCT URINALYSIS DIP (DEVICE)
Bilirubin Urine: NEGATIVE
GLUCOSE, UA: 100 mg/dL — AB
Hgb urine dipstick: NEGATIVE
Ketones, ur: NEGATIVE mg/dL
LEUKOCYTES UA: NEGATIVE
NITRITE: NEGATIVE
Protein, ur: NEGATIVE mg/dL
Specific Gravity, Urine: 1.01 (ref 1.005–1.030)
UROBILINOGEN UA: 0.2 mg/dL (ref 0.0–1.0)
pH: 7 (ref 5.0–8.0)

## 2015-07-18 MED ORDER — INSULIN NPH (HUMAN) (ISOPHANE) 100 UNIT/ML ~~LOC~~ SUSP: SUBCUTANEOUS | Status: DC

## 2015-07-18 MED ORDER — INSULIN REGULAR HUMAN 100 UNIT/ML IJ SOLN: INTRAMUSCULAR | Status: DC

## 2015-07-18 NOTE — Progress Notes (Signed)
Subjective:  Lindsay Morrison is a 30 y.o. G3P2002 at 6566w3d being seen today for ongoing prenatal care.  She is currently monitored for the following issues for this high-risk pregnancy and has Diabetes mellitus affecting pregnancy, antepartum; Hypertension; Previous cesarean delivery, delivered; Supervision of other high-risk pregnancy; Hypertension affecting pregnancy; Obesity affecting pregnancy, antepartum; and History of macrosomia in infant in prior pregnancy, currently pregnant on her problem list.  Patient reports no complaints. +FM, no vaginal bleeding, no contractions. Denies leaking of fluid.   The following portions of the patient's history were reviewed and updated as appropriate: allergies, current medications, past family history, past medical history, past social history, past surgical history and problem list. Problem list updated.  Objective:  There were no vitals filed for this visit.  Fetal Status:           General:  Alert, oriented and cooperative. Patient is in no acute distress.  Skin: Skin is warm and dry. No rash noted.   Cardiovascular: Normal heart rate noted  Respiratory: Normal respiratory effort, no problems with respiration noted  Abdomen: Soft, gravid, appropriate for gestational age.       Pelvic: Cervical exam deferred        Extremities: Normal range of motion.     Mental Status: Normal mood and affect. Normal behavior. Normal judgment and thought content.   Urinalysis:      Reviewed log and ALL values are over range.  Fastings 110-120 2hr PP 125-234  Assessment and Plan:  Pregnancy: G3P2002 at 7366w3d  1. Hypertension affecting pregnancy, third trimester Well controlled today  2. Diabetes mellitus affecting pregnancy, antepartum Poorly controlled. We discussed this at length today.  Increased insulin regimen  NPH 36/16; Regular 20/15 appt in 2 weeks with DM education  3. History of macrosomia in infant in prior pregnancy, currently pregnant,  third trimester At last US was > 90th% for weight  ~28wk  4. Obesity affecting pregnancy, antepartum, third trimester  5. Previous cesarean delivery, delivered Planning on CS  6. Supervision of other high-risk pregnancy, third trimester Up to date  Preterm labor symptoms and general obstetric precautions including but not limited to vaginal bleeding, contractions, leaking of fluid and fetal movement were reviewed in detail with the patient. Please refer to After Visit Summary for other counseling recommendations.  No Follow-up on file.  Future Appointments Date Time Provider Department Center  08/05/2015 7:45 AM [facility] US 2 [facility]  09/02/2015 7:45 AM [facility] US 2 [facility]     Federico FlakeKimberly Niles Newton, MD

## 2015-07-18 NOTE — Patient Instructions (Addendum)
Diabetes mellitus gestacional (Gestational Diabetes Mellitus) La diabetes mellitus gestacional, ms comnmente conocida como diabetes gestacional es un tipo de diabetes que desarrollan algunas mujeres durante el embarazo. En la diabetes gestacional, el pncreas no produce suficiente insulina (una hormona) o las clulas son menos sensibles a la insulina producida (resistencia a la insulina), o ambas cosas. Normalmente, la insulina mueve los azcares de los alimentos a las clulas de los tejidos. Las clulas de los tejidos utilizan los azcares para obtener energa. La falta de insulina o la falta de una respuesta normal a la insulina hace que el exceso de azcar se acumule en la sangre en lugar de penetrar en las clulas de los tejidos. Como resultado, se producen niveles altos de azcar en la sangre (hiperglucemia). El efecto de los niveles altos de azcar (glucosa) puede causar muchos problemas.  FACTORES DE RIESGO Usted tiene mayor probabilidad de desarrollar diabetes gestacional si tiene antecedentes familiares de diabetes y tambin si tiene uno o ms de los siguientes factores de riesgo:  ndice de masa corporal superior a 30 (obesidad).  Embarazo previo con diabetes gestacional.  La edad avanzada en el momento del embarazo. Si se mantienen los niveles de glucosa en la sangre en un rango normal durante el embarazo, las mujeres pueden tener un embarazo saludable. Si los niveles de glucosa en la sangre no estn bien controlados, puede haber riesgos para usted, el feto o el recin nacido, o durante el trabajo de parto y el parto.  SNTOMAS  Si se presentan sntomas, stos son similares a los sntomas que normalmente experimentar durante el embarazo. Los sntomas de la diabetes gestacional son:   Aumento de la sed (polidipsia).  Aumento de la miccin (poliuria).  Orina con ms frecuencia durante la noche (nocturia).  Prdida de peso. La prdida de peso puede ser muy rpida.  Infecciones  frecuentes y recurrentes.  Cansancio (fatiga).  Debilidad.  Cambios en la visin, como visin borrosa.  Olor a fruta en el aliento.  Dolor abdominal. DIAGNSTICO La diabetes se diagnostica cuando hay aumento de los niveles de glucosa en la sangre. El nivel de glucosa en la sangre puede controlarse en uno o ms de los siguientes anlisis de sangre:  Medicin de glucosa en la sangre en ayunas. No se le permitir comer durante al menos 8 horas antes de que se tome una muestra de sangre.  Pruebas al azar de glucosa en la sangre. El nivel de glucosa en la sangre se controla en cualquier momento del da sin importar el momento en que haya comido.  Prueba de tolerancia a la glucosa oral (PTGO). La glucosa en la sangre se mide despus de no haber comido (ayunas) durante una a tres horas y despus de beber una bebida que contenga glucosa. Dado que las hormonas que causan la resistencia a la insulina son ms altas alrededor de las semanas 24 a 28 de embarazo, generalmente se realiza una PTGO durante ese tiempo. Si tiene factores de riesgo, en la primera visita prenatal pueden hacerle pruebas de deteccin de diabetes tipo 2 no diagnosticada. TRATAMIENTO  La diabetes gestacional debe controlarse en primer lugar con dieta y ejercicios. Pueden agregarse medicamentos, pero solo si son necesarios.  Usted tendr que tomar medicamentos para la diabetes o insulina diariamente para mantener los niveles de glucosa en la sangre en el rango deseado.  Usted tendr que combinar la dosis de insulina con la actividad fsica y la eleccin de alimentos saludables. Si tiene diabetes gestacional, el objetivo del tratamiento ser   mantener los siguientes niveles sanguneos de glucosa:  Antes de las comidas (preprandial): valor de 95 mg/dl o inferior.  Despus de las comidas (posprandial):  Una hora despus de la comida: valor de 140 mg/dl o inferior.  Dos horas despus de la comida: valor de 120 mg/dl o  inferior. Si tiene diabetes tipo 1 o tipo 2 preexistente, el objetivo del tratamiento ser mantener los siguientes niveles sanguneos de glucosa:  Antes de las comidas, a la hora de acostarse y durante la noche: de 60 a 99 mg/dl.  Despus de las comidas: valor mximo de 100 a 129 mg/dl. INSTRUCCIONES PARA EL CUIDADO EN EL HOGAR   Controle su nivel de hemoglobina A1c dos veces al ao.  Contrlese a diario el nivel de glucosa en la sangre segn las indicaciones de su mdico. Es comn realizar controles frecuentes de la glucosa en la sangre.  Supervise las cetonas en la orina cuando est enferma y segn las indicaciones de su mdico.  Tome el medicamento para la diabetes y adminstrese insulina segn las indicaciones de su mdico para mantener el nivel de glucosa en la sangre en el rango deseado.  Nunca se quede sin medicamento para la diabetes o sin insulina. Es necesario que la reciba todos los das.  Ajuste la insulina segn la ingesta de hidratos de carbono. Los hidratos de carbono pueden aumentar los niveles de glucosa en la sangre, pero deben incluirse en su dieta. Los hidratos de carbono aportan vitaminas, minerales y fibra que son una parte esencial de una dieta saludable. Los hidratos de carbono se encuentran en frutas, verduras, cereales integrales, productos lcteos, legumbres y alimentos que contienen azcares aadidos.  Consuma alimentos saludables. Alterne 3 comidas con 3 colaciones.  Aumente de peso saludablemente. El aumento del peso total vara de acuerdo con el ndice de masa corporal que tena antes del embarazo ([facility]).  Lleve una tarjeta de alerta mdica o use una pulsera o medalla de alerta mdica.  Lleve con usted una colacin de 15gramos de hidratos de carbono en todo momento para controlar los niveles bajos de glucosa en la sangre (hipoglucemia). Algunos ejemplos de colaciones de 15gramos de hidratos de carbono son los siguientes:  Tabletas de glucosa, 3 o 4.  Gel  de glucosa, tubo de 15 gramos.  Pasas de uva, 2 cucharadas (24 g).  Caramelos de goma, 6.  Galletas de animales, 8.  Jugo de fruta, gaseosa comn, o leche descremada, 4 onzas (120 ml).  Pastillas de goma, 9.  Reconocer la hipoglucemia. Durante el embarazo la hipoglucemia se produce cuando hay niveles de glucosa en la sangre de 60 mg/dl o menos. El riesgo de hipoglucemia aumenta durante el ayuno o cuando se saltea las comidas, durante o despus de realizar ejercicio intenso y mientras duerme. Los sntomas de hipoglucemia son:  Temblores o sacudidas.  Disminucin de la capacidad de concentracin.  Sudoracin.  Aumento de la frecuencia cardaca.  Dolor de cabeza.  Sequedad en la boca.  Hambre.  Irritabilidad.  Ansiedad.  Sueo agitado.  Alteracin del habla o de la coordinacin.  Confusin.  Tratar la hipoglucemia rpidamente. Si usted est alerta y puede tragar con seguridad, siga la regla de 15/15 que consiste en:  Tome entre 15 y 20gramos de glucosa de accin rpida o carbohidratos. Las opciones de accin rpida son un gel de glucosa, tabletas de glucosa, o 4 onzas (120 ml) de jugo de frutas, gaseosa comn, o leche baja en grasa.  Compruebe su nivel de glucosa en la sangre   15 minutos despus de tomar la glucosa.  Tome entre 15 y 20 gramos ms de glucosa si el nivel de glucosa en la sangre todava es de 70mg/dl o inferior.  Ingiera una comida o una colacin en el lapso de 1 hora una vez que los niveles de glucosa en la sangre vuelven a la normalidad.  Est atento a la poliuria (miccin excesiva) y la polidipsia (sensacin de mucha sed), que son los primeros signos de la hiperglucemia. El reconocimiento temprano de la hiperglucemia permite un tratamiento oportuno. Trate la hiperglucemia segn le indic su mdico.  Haga actividad fsica por lo menos 30minutos al da o como lo indique su mdico. Se recomienda que 30 minutos despus de cada comida, realice diez minutos  de actividad fsica para controlar los niveles de glucosa postprandial en la sangre.  Ajuste su dosis de insulina y la ingesta de alimentos, segn sea necesario, si inicia un nuevo ejercicio o deporte.  Siga su plan para los das de enfermedad cuando no pueda comer o beber como de costumbre.  Evite el tabaco y el alcohol.  Concurra a todas las visitas de control como se lo haya indicado el mdico.  Siga el consejo del mdico respecto a los controles prenatales y posteriores al parto (postparto), las visitas, la planificacin de las comidas, el ejercicio, los medicamentos, las vitaminas, los anlisis de sangre, otras pruebas mdicas y actividades fsicas.  Realice diariamente el cuidado de la piel y de los pies. Examine su piel y los pies diariamente para ver si tiene cortes, moretones, enrojecimiento, problemas en las uas, sangrado, ampollas o llagas.  Cepllese los dientes y encas por lo menos dos veces al da y use hilo dental al menos una vez por da. Concurra regularmente a las visitas de control con el dentista.  Programe un examen de vista durante el primer trimestre de su embarazo o como lo indique su mdico.  Comparta su plan de control de diabetes en el trabajo o en la escuela.  Mantngase al da con las vacunas.  Aprenda a manejar el estrs.  Obtenga la mayor cantidad posible de informacin sobre la diabetes y solicite ayuda siempre que sea necesario.  Obtenga informacin sobre el amamantamiento y analice esta posibilidad.  Debe controlar el nivel de azcar en la sangre de 6a 12semanas despus del parto. Esto se hace con una prueba de tolerancia a la glucosa oral (PTGO). SOLICITE ATENCIN MDICA SI:   No puede comer alimentos o beber por ms de 6 horas.  Tuvo nuseas o ha vomitado durante ms de 6 horas.  Tiene un nivel de glucosa en la sangre de 200 mg/dl y cetonas en la orina.  Presenta algn cambio en el estado mental.  Desarrolla problemas de visin.  Sufre  un dolor persistente de cabeza.  Siente dolor o molestias en la parte superior del abdomen.  Desarrolla una enfermedad grave adicional.  Tuvo diarrea durante ms de 6 horas.  Ha estado enfermo o ha tenido fiebre durante un par de das y no mejora. SOLICITE ATENCIN MDICA DE INMEDIATO SI:   Tiene dificultad para respirar.  Ya no siente los movimientos del beb.  Est sangrando o tiene flujo vaginal.  Comienza a tener contracciones o trabajo de parto prematuro. ASEGRESE DE QUE:  Comprende estas instrucciones.  Controlar su afeccin.  Recibir ayuda de inmediato si no mejora o si empeora.   Esta informacin no tiene como fin reemplazar el consejo del mdico. Asegrese de hacerle al mdico cualquier pregunta que tenga.     Document Released: 11/01/2004 Document Revised: 02/12/2014 Elsevier Interactive Patient Education 2016 [employer]  

## 2015-07-20 ENCOUNTER — Telehealth: Payer: Self-pay | Admitting: *Deleted

## 2015-07-20 NOTE — Telephone Encounter (Signed)
-----   Message from Federico FlakeKimberly Niles Newton, MD sent at 07/18/2015 12:21 PM EDT ----- Regarding: Call patient to review sugars Patient insulin was increased 07/18/2015 and she could not come in for a 1 week visit with DM Education. Can you call to review sugars and possibly review with provider in clinic.   Speaks spanish  Thanks, Dr. Alvester MorinNewton

## 2015-07-20 NOTE — Telephone Encounter (Signed)
Attempted to call patient using Spanish interpreter # [phone]. There was no answer. Voice mail message left stating I am calling in regards to her recent change in insulin and checking on her blood sugars. Please return my call at the clinics.

## 2015-07-25 ENCOUNTER — Encounter (HOSPITAL_COMMUNITY): Payer: Self-pay | Admitting: *Deleted

## 2015-07-27 ENCOUNTER — Encounter: Payer: Self-pay | Admitting: Obstetrics & Gynecology

## 2015-07-27 NOTE — Telephone Encounter (Signed)
Called patient with pacific interpreter [phone], no answer- left message to call us back at the clinics

## 2015-08-01 ENCOUNTER — Ambulatory Visit (INDEPENDENT_AMBULATORY_CARE_PROVIDER_SITE_OTHER): Payer: Self-pay | Admitting: Obstetrics and Gynecology

## 2015-08-01 VITALS — BP 102/51 | HR 90 | Wt 194.0 lb

## 2015-08-01 DIAGNOSIS — O24913 Unspecified diabetes mellitus in pregnancy, third trimester: Secondary | ICD-10-CM

## 2015-08-01 DIAGNOSIS — O24313 Unspecified pre-existing diabetes mellitus in pregnancy, third trimester: Secondary | ICD-10-CM

## 2015-08-01 LAB — POCT URINALYSIS DIP (DEVICE)
BILIRUBIN URINE: NEGATIVE
Glucose, UA: 100 mg/dL — AB
HGB URINE DIPSTICK: NEGATIVE
Ketones, ur: NEGATIVE mg/dL
LEUKOCYTES UA: NEGATIVE
Nitrite: NEGATIVE
Protein, ur: NEGATIVE mg/dL
SPECIFIC GRAVITY, URINE: 1.01 (ref 1.005–1.030)
Urobilinogen, UA: 0.2 mg/dL (ref 0.0–1.0)
pH: 6.5 (ref 5.0–8.0)

## 2015-08-01 NOTE — Progress Notes (Signed)
Blanca used for interpreter 

## 2015-08-01 NOTE — Progress Notes (Signed)
Subjective:  Bernadette Cannon KettleS Sunga is a 30 y.o. G3P2002 at 185w3d being seen today for ongoing prenatal care.  She is currently monitored for the following issues for this high-risk pregnancy and has Diabetes mellitus affecting pregnancy, antepartum; Hypertension; Previous cesarean delivery, delivered; Supervision of other high-risk pregnancy; Hypertension affecting pregnancy; Obesity affecting pregnancy, antepartum; and History of macrosomia in infant in prior pregnancy, currently pregnant on her problem list.  Patient reports no complaints.  Contractions: Not present. Vag. Bleeding: None.  Movement: Present. Denies leaking of fluid.   The following portions of the patient's history were reviewed and updated as appropriate: allergies, current medications, past family history, past medical history, past social history, past surgical history and problem list. Problem list updated.  Objective:   Filed Vitals:   08/01/15 1125  BP: 102/51  Pulse: 90  Weight: 194 lb (87.998 kg)    Fetal Status:     Movement: Present     General:  Alert, oriented and cooperative. Patient is in no acute distress.  Skin: Skin is warm and dry. No rash noted.   Cardiovascular: Normal heart rate noted  Respiratory: Normal respiratory effort, no problems with respiration noted  Abdomen: Soft, gravid, appropriate for gestational age. Pain/Pressure: Absent     Pelvic: Cervical exam deferred        Extremities: Normal range of motion.  Edema: None  Mental Status: Normal mood and affect. Normal behavior. Normal judgment and thought content.   Urinalysis: Urine Protein: Negative Urine Glucose: 1+  Assessment and Plan:  Pregnancy: G3P2002 at 475w3d  1. Diabetes mellitus affecting pregnancy in third trimester - fastings and PPs above goal. Increase nph from 36/20 to 40/24, and regular from 20/16 to 24/20; dm educator f/u one week - Fetal nonstress test reactive - Creatinine clearance, urine, 24 hour (says was previously on  acei for kidney protection. Proteinuria?) - Protein, urine, 24 hour - u/s for growth 6/30  Preterm labor symptoms and general obstetric precautions including but not limited to vaginal bleeding, contractions, leaking of fluid and fetal movement were reviewed in detail with the patient. Please refer to After Visit Summary for other counseling recommendations.  Return in about 4 days (around 08/05/2015) for NST.   Kathrynn RunningNoah Bedford Klay Sobotka, MD

## 2015-08-05 ENCOUNTER — Encounter (HOSPITAL_COMMUNITY): Payer: Self-pay

## 2015-08-05 ENCOUNTER — Ambulatory Visit (INDEPENDENT_AMBULATORY_CARE_PROVIDER_SITE_OTHER): Payer: Self-pay | Admitting: *Deleted

## 2015-08-05 ENCOUNTER — Ambulatory Visit (HOSPITAL_COMMUNITY)
Admission: RE | Admit: 2015-08-05 | Discharge: 2015-08-05 | Disposition: A | Discharge status: Home / Self Care | Payer: Self-pay | Source: Ambulatory Visit | Attending: Obstetrics & Gynecology | Admitting: Obstetrics & Gynecology

## 2015-08-05 VITALS — BP 102/51 | HR 92

## 2015-08-05 DIAGNOSIS — Z36 Encounter for antenatal screening of mother: Secondary | ICD-10-CM | POA: Insufficient documentation

## 2015-08-05 DIAGNOSIS — O09293 Supervision of pregnancy with other poor reproductive or obstetric history, third trimester: Secondary | ICD-10-CM | POA: Insufficient documentation

## 2015-08-05 DIAGNOSIS — Z3A33 33 weeks gestation of pregnancy: Secondary | ICD-10-CM | POA: Insufficient documentation

## 2015-08-05 DIAGNOSIS — O24414 Gestational diabetes mellitus in pregnancy, insulin controlled: Secondary | ICD-10-CM | POA: Insufficient documentation

## 2015-08-05 DIAGNOSIS — O99213 Obesity complicating pregnancy, third trimester: Secondary | ICD-10-CM | POA: Insufficient documentation

## 2015-08-05 DIAGNOSIS — O10013 Pre-existing essential hypertension complicating pregnancy, third trimester: Secondary | ICD-10-CM | POA: Insufficient documentation

## 2015-08-05 DIAGNOSIS — O163 Unspecified maternal hypertension, third trimester: Secondary | ICD-10-CM

## 2015-08-05 DIAGNOSIS — O169 Unspecified maternal hypertension, unspecified trimester: Secondary | ICD-10-CM

## 2015-08-05 LAB — COMPREHENSIVE METABOLIC PANEL
ALBUMIN: 3.3 g/dL — AB (ref 3.6–5.1)
ALT: 8 U/L (ref 6–29)
AST: 12 U/L (ref 10–30)
Alkaline Phosphatase: 114 U/L (ref 33–115)
BUN: 4 mg/dL — AB (ref 7–25)
CHLORIDE: 105 mmol/L (ref 98–110)
CO2: 22 mmol/L (ref 20–31)
CREATININE: 0.43 mg/dL — AB (ref 0.50–1.10)
Calcium: 8.8 mg/dL (ref 8.6–10.2)
Glucose, Bld: 122 mg/dL — ABNORMAL HIGH (ref 65–99)
Potassium: 3.8 mmol/L (ref 3.5–5.3)
SODIUM: 139 mmol/L (ref 135–146)
Total Bilirubin: 0.4 mg/dL (ref 0.2–1.2)
Total Protein: 6 g/dL — ABNORMAL LOW (ref 6.1–8.1)

## 2015-08-05 LAB — CBC
HCT: 35.3 % (ref 35.0–45.0)
Hemoglobin: 11.9 g/dL (ref 11.7–15.5)
MCH: 31.5 pg (ref 27.0–33.0)
MCHC: 33.7 g/dL (ref 32.0–36.0)
MCV: 93.4 fL (ref 80.0–100.0)
MPV: 10.7 fL (ref 7.5–12.5)
PLATELETS: 225 10*3/uL (ref 140–400)
RBC: 3.78 MIL/uL — ABNORMAL LOW (ref 3.80–5.10)
RDW: 14 % (ref 11.0–15.0)
WBC: 8.1 10*3/uL (ref 3.8–10.8)

## 2015-08-05 NOTE — Progress Notes (Signed)
Used Stage managerVideo Interpreter Cristian [phone] . Patient had insulin dosage increased on Monday 08/01/15-brought her cbg diary with her- fasting cbg's 86-100 since then, after dinner cbg123-167, bedtime cbg 130-170.  CBG;s reviewed with Dr. Burnice LoganHarrawayKatrinka Blazing- Smith- no new orders. Will review next week. Explained to patient  With Video interpreter Aura [phone] no new orders today- continue taking as instructed. Also instructed she  should be coming twice weekly for nst's and see doctor weekly. Patient is requesting to not come until  08/15/15. Repeatedly stressed we need her to come next week to see doctor and get nst. Sent to registrar to schedule with patient.

## 2015-08-05 NOTE — Progress Notes (Signed)
NST performed today was reviewed and was found to be reactive.  Continue recommended antenatal testing and prenatal care.  

## 2015-08-11 LAB — PROTEIN, URINE, 24 HOUR
PROTEIN 24H UR: 140 mg/(24.h) (ref <150)
Protein, Urine: 5 mg/dL (ref 5–24)

## 2015-08-11 LAB — CREATININE CLEARANCE, URINE, 24 HOUR
Creatinine Clearance: 121 mL/min — ABNORMAL HIGH (ref 75–115)
Creatinine, 24H Ur: 0.87 g/(24.h) (ref 0.63–2.50)
Creatinine, Urine: 31 mg/dL (ref 20–320)
Creatinine: 0.5 mg/dL (ref 0.50–1.10)

## 2015-08-15 ENCOUNTER — Ambulatory Visit (INDEPENDENT_AMBULATORY_CARE_PROVIDER_SITE_OTHER): Payer: Self-pay | Admitting: Obstetrics and Gynecology

## 2015-08-15 VITALS — BP 111/52 | HR 91 | Wt 193.3 lb

## 2015-08-15 DIAGNOSIS — O163 Unspecified maternal hypertension, third trimester: Secondary | ICD-10-CM

## 2015-08-15 DIAGNOSIS — O24919 Unspecified diabetes mellitus in pregnancy, unspecified trimester: Secondary | ICD-10-CM

## 2015-08-15 DIAGNOSIS — O24913 Unspecified diabetes mellitus in pregnancy, third trimester: Secondary | ICD-10-CM

## 2015-08-15 LAB — POCT URINALYSIS DIP (DEVICE)
Bilirubin Urine: NEGATIVE
GLUCOSE, UA: NEGATIVE mg/dL
KETONES UR: NEGATIVE mg/dL
LEUKOCYTES UA: NEGATIVE
NITRITE: NEGATIVE
PROTEIN: NEGATIVE mg/dL
SPECIFIC GRAVITY, URINE: 1.015 (ref 1.005–1.030)
UROBILINOGEN UA: 0.2 mg/dL (ref 0.0–1.0)
pH: 7 (ref 5.0–8.0)

## 2015-08-15 LAB — HEMOGLOBIN A1C
Hgb A1c MFr Bld: 5.8 % — ABNORMAL HIGH (ref <5.7)
Mean Plasma Glucose: 120 mg/dL

## 2015-08-15 NOTE — Progress Notes (Signed)
Subjective:  Lindsay Morrison is a 30 y.o. G3P2002 at 5253w3d being seen today for ongoing prenatal care.  She is currently monitored for the following issues for this high-risk pregnancy and has Diabetes mellitus affecting pregnancy, antepartum; Hypertension; Previous cesarean delivery, delivered; Supervision of other high-risk pregnancy; Hypertension affecting pregnancy; Obesity affecting pregnancy, antepartum; and History of macrosomia in infant in prior pregnancy, currently pregnant on her problem list.  Patient reports no complaints.  Contractions: Not present. Vag. Bleeding: Scant.  Movement: Present. Denies leaking of fluid.   The following portions of the patient's history were reviewed and updated as appropriate: allergies, current medications, past family history, past medical history, past social history, past surgical history and problem list. Problem list updated.  Objective:   Filed Vitals:   08/15/15 0924  BP: 111/52  Pulse: 91  Weight: 193 lb 4.8 oz (87.68 kg)    Fetal Status: Fetal Heart Rate (bpm): NST   Movement: Present     General:  Alert, oriented and cooperative. Patient is in no acute distress.  Skin: Skin is warm and dry. No rash noted.   Cardiovascular: Normal heart rate noted  Respiratory: Normal respiratory effort, no problems with respiration noted  Abdomen: Soft, gravid, appropriate for gestational age. Pain/Pressure: Absent     Pelvic:  Cervical exam deferred        Extremities: Normal range of motion.  Edema: None  Mental Status: Normal mood and affect. Normal behavior. Normal judgment and thought content.   Urinalysis:      Assessment and Plan:  Pregnancy: G3P2002 at 853w3d  1. Diabetes mellitus affecting pregnancy, antepartum fastings and PPs above goal. 15% insulin increase. nph 43/27, novolog 27/23. F/u one week - nst and afi today, will update if not reactive/normal - u/s scheduled 7/28\ - a1c today  2. Hypertension affecting pregnancy, third  trimester - bp wnl  # hx 2 previous c/s - c/s scheduled for 39+2   Preterm labor symptoms and general obstetric precautions including but not limited to vaginal bleeding, contractions, leaking of fluid and fetal movement were reviewed in detail with the patient. Please refer to After Visit Summary for other counseling recommendations.  Return in about 2 days (around 08/17/2015) for NST.   Lindsay RunningNoah Bedford Tsering Leaman, MD

## 2015-08-15 NOTE — Progress Notes (Signed)
Interpreter Elna Breslowarol Hernandez present for encounter. Pt reports 2 episodes of vaginal spotting during last week.

## 2015-08-24 ENCOUNTER — Ambulatory Visit (INDEPENDENT_AMBULATORY_CARE_PROVIDER_SITE_OTHER): Payer: Self-pay | Admitting: Family

## 2015-08-24 VITALS — BP 90/46 | HR 87 | Wt 194.0 lb

## 2015-08-24 DIAGNOSIS — O34219 Maternal care for unspecified type scar from previous cesarean delivery: Secondary | ICD-10-CM

## 2015-08-24 DIAGNOSIS — O24913 Unspecified diabetes mellitus in pregnancy, third trimester: Secondary | ICD-10-CM

## 2015-08-24 DIAGNOSIS — O24313 Unspecified pre-existing diabetes mellitus in pregnancy, third trimester: Secondary | ICD-10-CM

## 2015-08-24 DIAGNOSIS — O3660X0 Maternal care for excessive fetal growth, unspecified trimester, not applicable or unspecified: Secondary | ICD-10-CM | POA: Insufficient documentation

## 2015-08-24 DIAGNOSIS — O24912 Unspecified diabetes mellitus in pregnancy, second trimester: Secondary | ICD-10-CM

## 2015-08-24 DIAGNOSIS — O24919 Unspecified diabetes mellitus in pregnancy, unspecified trimester: Secondary | ICD-10-CM

## 2015-08-24 DIAGNOSIS — Z36 Encounter for antenatal screening of mother: Secondary | ICD-10-CM

## 2015-08-24 DIAGNOSIS — O09893 Supervision of other high risk pregnancies, third trimester: Secondary | ICD-10-CM

## 2015-08-24 DIAGNOSIS — O163 Unspecified maternal hypertension, third trimester: Secondary | ICD-10-CM

## 2015-08-24 DIAGNOSIS — O3660X1 Maternal care for excessive fetal growth, unspecified trimester, fetus 1: Secondary | ICD-10-CM

## 2015-08-24 LAB — POCT URINALYSIS DIP (DEVICE)
BILIRUBIN URINE: NEGATIVE
Glucose, UA: NEGATIVE mg/dL
Hgb urine dipstick: NEGATIVE
Ketones, ur: NEGATIVE mg/dL
LEUKOCYTES UA: NEGATIVE
NITRITE: NEGATIVE
Protein, ur: NEGATIVE mg/dL
Specific Gravity, Urine: 1.02 (ref 1.005–1.030)
Urobilinogen, UA: 0.2 mg/dL (ref 0.0–1.0)
pH: 7 (ref 5.0–8.0)

## 2015-08-24 MED ORDER — INSULIN NPH (HUMAN) (ISOPHANE) 100 UNIT/ML ~~LOC~~ SUSP: SUBCUTANEOUS | Status: DC

## 2015-08-24 MED ORDER — INSULIN REGULAR HUMAN 100 UNIT/ML IJ SOLN: INTRAMUSCULAR | Status: DC

## 2015-08-24 NOTE — Progress Notes (Signed)
Pt reports insulin dosages which are different from what was stated in the note by Dr. Ashok PallWouk on 7/10.

## 2015-08-24 NOTE — Progress Notes (Signed)
Fasting PPB PPL PPD  70-100, 3/9 abnl 110-160, 5/9 abnl 113-160, 6/9 abnl 110-158, 8/9 abnl   Video interpreter:  [phone] Subjective:  Lindsay Morrison is a 30 y.o. G3P2002 at 6733w5d being seen today for ongoing prenatal care.  She is currently monitored for the following issues for this high-risk pregnancy and has Diabetes mellitus affecting pregnancy, antepartum; Hypertension; Previous cesarean delivery, delivered; Supervision of other high-risk pregnancy; Hypertension affecting pregnancy; Obesity affecting pregnancy, antepartum; History of macrosomia in infant in prior pregnancy, currently pregnant; and Fetal macrosomia during pregnancy, antepartum on her problem list.  Patient reports no complaints. Patient was taking NPH 43 in AM and 27 HS and Reg 27 AM and 23 PM Contractions: Not present. Vag. Bleeding: None.  Movement: Present. Denies leaking of fluid.   The following portions of the patient's history were reviewed and updated as appropriate: allergies, current medications, past family history, past medical history, past social history, past surgical history and problem list. Problem list updated.  Objective:   Filed Vitals:   08/24/15 1000  BP: 90/46  Pulse: 87  Weight: 194 lb (87.998 kg)    Fetal Status: Fetal Heart Rate (bpm): NST-R   Movement: Present  Presentation: Transverse  General:  Alert, oriented and cooperative. Patient is in no acute distress.  Skin: Skin is warm and dry. No rash noted.   Cardiovascular: Normal heart rate noted  Respiratory: Normal respiratory effort, no problems with respiration noted  Abdomen: Soft, gravid, appropriate for gestational age. Pain/Pressure: Absent     Pelvic:  Cervical exam deferred        Extremities: Normal range of motion.  Edema: None  Mental Status: Normal mood and affect. Normal behavior. Normal judgment and thought content.   Urinalysis: Urine Protein: Negative Urine Glucose: Negative  Assessment and Plan:  Pregnancy: G3P2002 at  833w5d  1. Hypertension affecting pregnancy, third trimester - Amniotic fluid index with NST > reactive  2. Diabetes mellitus affecting pregnancy in third trimester - Amniotic fluid index with NST  3. Supervision of other high-risk pregnancy, third trimester - Transverse lie > reassess next visit  4. Diabetes mellitus affecting pregnancy, antepartum - Adjusted insulin to NPH 47 AM and 27 HS (no change); Reg 27 AM (no change) and 25 PM - insulin NPH Human (HUMULIN N,NOVOLIN N) 100 UNIT/ML injection; 47 units [place] before breakfast and 27 units [place] before bedtime  Dispense: 10 mL; Refill: 3  5. Previous cesarean delivery, delivered - Plans repeat  6. Fetal macrosomia during pregnancy, antepartum, unspecified trimester, fetus 1 - Close observation > planned csection  7. Diabetes mellitus affecting pregnancy in second trimester - insulin regular (HUMULIN R) 100 units/mL injection; Take 27 units [place] before breakfast and 25 units [place] before dinner  Dispense: 10 mL; Refill: 11  Preterm labor symptoms and general obstetric precautions including but not limited to vaginal bleeding, contractions, leaking of fluid and fetal movement were reviewed in detail with the patient. Please refer to After Visit Summary for other counseling recommendations.  Return for Twice weekly testing and Ob fu in 1 week.   Eino FarberWalidah Kennith GainN Karim, CNM

## 2015-08-29 ENCOUNTER — Telehealth: Payer: Self-pay | Admitting: *Deleted

## 2015-08-29 ENCOUNTER — Ambulatory Visit (INDEPENDENT_AMBULATORY_CARE_PROVIDER_SITE_OTHER): Payer: Self-pay | Admitting: Advanced Practice Midwife

## 2015-08-29 VITALS — BP 115/61 | HR 90 | Wt 193.4 lb

## 2015-08-29 DIAGNOSIS — Z113 Encounter for screening for infections with a predominantly sexual mode of transmission: Secondary | ICD-10-CM

## 2015-08-29 DIAGNOSIS — O09893 Supervision of other high risk pregnancies, third trimester: Secondary | ICD-10-CM

## 2015-08-29 DIAGNOSIS — O24312 Unspecified pre-existing diabetes mellitus in pregnancy, second trimester: Secondary | ICD-10-CM

## 2015-08-29 DIAGNOSIS — O24913 Unspecified diabetes mellitus in pregnancy, third trimester: Secondary | ICD-10-CM

## 2015-08-29 DIAGNOSIS — O3663X1 Maternal care for excessive fetal growth, third trimester, fetus 1: Secondary | ICD-10-CM

## 2015-08-29 DIAGNOSIS — O24912 Unspecified diabetes mellitus in pregnancy, second trimester: Secondary | ICD-10-CM

## 2015-08-29 DIAGNOSIS — O24919 Unspecified diabetes mellitus in pregnancy, unspecified trimester: Secondary | ICD-10-CM

## 2015-08-29 DIAGNOSIS — O24313 Unspecified pre-existing diabetes mellitus in pregnancy, third trimester: Secondary | ICD-10-CM

## 2015-08-29 DIAGNOSIS — O163 Unspecified maternal hypertension, third trimester: Secondary | ICD-10-CM

## 2015-08-29 DIAGNOSIS — Z3A37 37 weeks gestation of pregnancy: Secondary | ICD-10-CM

## 2015-08-29 LAB — OB RESULTS CONSOLE GBS: STREP GROUP B AG: POSITIVE

## 2015-08-29 MED ORDER — INSULIN REGULAR HUMAN 100 UNIT/ML IJ SOLN
INTRAMUSCULAR | 11 refills | Status: DC
Start: 2015-08-29 — End: 2015-09-07

## 2015-08-29 MED ORDER — INSULIN NPH (HUMAN) (ISOPHANE) 100 UNIT/ML ~~LOC~~ SUSP: SUBCUTANEOUS | 3 refills | Status: DC

## 2015-08-29 NOTE — Telephone Encounter (Signed)
Received a call from [facility]  pharmacy requesting change from Humulin R to Novolin R, is already on Novolin N. Order approved

## 2015-08-29 NOTE — Progress Notes (Signed)
Fasting CBGs: (1/5 out of range) PCB:  110-120's (2/5 out of range) PCL:  120's (4/5 out of range) PCD: 120-140's (5/5 out of range)  Increase NPH in am and Reg at breakfast and dinner by 2 units per consultation with Dr. Debroah Loop.   Subjective:  Lindsay Morrison is a 30 y.o. G3P2002 at [redacted]w[redacted]d being seen today for ongoing prenatal care.  She is currently monitored for the following issues for this high-risk pregnancy and has Diabetes mellitus affecting pregnancy, antepartum; Hypertension; Previous cesarean delivery, delivered; Supervision of other high-risk pregnancy; Hypertension affecting pregnancy; Obesity affecting pregnancy, antepartum; History of macrosomia in infant in prior pregnancy, currently pregnant; and Fetal macrosomia during pregnancy, antepartum on her problem list.  Patient reports no complaints.  Contractions: Not present. Vag. Bleeding: None.  Movement: Present. Denies leaking of fluid.   The following portions of the patient's history were reviewed and updated as appropriate: allergies, current medications, past family history, past medical history, past social history, past surgical history and problem list. Problem list updated.  Objective:   Vitals:   08/29/15 1112  Weight: 193 lb 6.4 oz (87.7 kg)    Fetal Status: Fetal Heart Rate (bpm): NST reactive   Movement: Present     General:  Alert, oriented and cooperative. Patient is in no acute distress.  Skin: Skin is warm and dry. No rash noted.   Cardiovascular: Normal heart rate noted  Respiratory: Normal respiratory effort, no problems with respiration noted  Abdomen: Soft, gravid, appropriate for gestational age. Pain/Pressure: Absent     Pelvic:  Cervical exam deferred        Extremities: Normal range of motion.     Mental Status: Normal mood and affect. Normal behavior. Normal judgment and thought content.   Urinalysis: Urine Protein: Negative Urine Glucose: Negative  Assessment and Plan:  Pregnancy: G3P2002 at  [redacted]w[redacted]d  1. Hypertension affecting pregnancy, third trimester  - Fetal nonstress test - Growth ultrasound scheduled 09/02/2015  2. Diabetes mellitus affecting pregnancy in third trimester  - Fetal nonstress test  3. Supervision of other high-risk pregnancy, third trimester  - Culture, Grp B Strep w/Rflx Suscept - GC/Chlamydia probe amp ([place])not at [facility]  4. Diabetes mellitus affecting pregnancy, antepartum  - insulin NPH Human (HUMULIN N,NOVOLIN N) 100 UNIT/ML injection; 49 units [place] before breakfast and 27 units [place] before bedtime  Dispense: 10 mL; Refill: 3  5. Diabetes mellitus affecting pregnancy in second trimester  - insulin regular (HUMULIN R) 100 units/mL injection; Take 29 units [place] before breakfast and 27 units [place] before dinner  Dispense: 10 mL; Refill: 11  Term labor symptoms and general obstetric precautions including but not limited to vaginal bleeding, contractions, leaking of fluid and fetal movement were reviewed in detail with the patient. Please refer to After Visit Summary for other counseling recommendations.  Return in about 7 days (around 09/05/2015) for Ob fu and NST.   Dorathy Kinsman, CNM

## 2015-08-29 NOTE — Progress Notes (Signed)
C/S scheduled 8/13

## 2015-08-30 LAB — POCT URINALYSIS DIP (DEVICE)
BILIRUBIN URINE: NEGATIVE
GLUCOSE, UA: NEGATIVE mg/dL
Ketones, ur: NEGATIVE mg/dL
LEUKOCYTES UA: NEGATIVE
NITRITE: NEGATIVE
Protein, ur: NEGATIVE mg/dL
Specific Gravity, Urine: 1.02 (ref 1.005–1.030)
UROBILINOGEN UA: 1 mg/dL (ref 0.0–1.0)
pH: 7 (ref 5.0–8.0)

## 2015-08-30 LAB — GC/CHLAMYDIA PROBE AMP (~~LOC~~) NOT AT ARMC
CHLAMYDIA, DNA PROBE: NEGATIVE
NEISSERIA GONORRHEA: NEGATIVE

## 2015-09-01 ENCOUNTER — Other Ambulatory Visit: Payer: Self-pay

## 2015-09-02 ENCOUNTER — Ambulatory Visit (HOSPITAL_COMMUNITY)
Admission: RE | Admit: 2015-09-02 | Discharge: 2015-09-02 | Disposition: A | Discharge status: Home / Self Care | Payer: Self-pay | Source: Ambulatory Visit | Attending: Obstetrics & Gynecology | Admitting: Obstetrics & Gynecology

## 2015-09-02 ENCOUNTER — Encounter (HOSPITAL_COMMUNITY): Payer: Self-pay

## 2015-09-02 DIAGNOSIS — O99213 Obesity complicating pregnancy, third trimester: Secondary | ICD-10-CM

## 2015-09-02 DIAGNOSIS — O169 Unspecified maternal hypertension, unspecified trimester: Secondary | ICD-10-CM

## 2015-09-02 DIAGNOSIS — O3660X1 Maternal care for excessive fetal growth, unspecified trimester, fetus 1: Secondary | ICD-10-CM

## 2015-09-02 DIAGNOSIS — O24913 Unspecified diabetes mellitus in pregnancy, third trimester: Secondary | ICD-10-CM

## 2015-09-02 DIAGNOSIS — O24919 Unspecified diabetes mellitus in pregnancy, unspecified trimester: Secondary | ICD-10-CM

## 2015-09-02 DIAGNOSIS — O09893 Supervision of other high risk pregnancies, third trimester: Secondary | ICD-10-CM

## 2015-09-02 DIAGNOSIS — O10013 Pre-existing essential hypertension complicating pregnancy, third trimester: Secondary | ICD-10-CM | POA: Insufficient documentation

## 2015-09-02 DIAGNOSIS — Z3A37 37 weeks gestation of pregnancy: Secondary | ICD-10-CM

## 2015-09-02 DIAGNOSIS — O09293 Supervision of pregnancy with other poor reproductive or obstetric history, third trimester: Secondary | ICD-10-CM

## 2015-09-02 DIAGNOSIS — O24414 Gestational diabetes mellitus in pregnancy, insulin controlled: Secondary | ICD-10-CM | POA: Insufficient documentation

## 2015-09-02 DIAGNOSIS — O163 Unspecified maternal hypertension, third trimester: Secondary | ICD-10-CM

## 2015-09-02 LAB — CULTURE, STREPTOCOCCUS GRP B W/SUSCEPT

## 2015-09-03 ENCOUNTER — Encounter: Payer: Self-pay | Admitting: Advanced Practice Midwife

## 2015-09-03 DIAGNOSIS — O99820 Streptococcus B carrier state complicating pregnancy: Secondary | ICD-10-CM | POA: Insufficient documentation

## 2015-09-05 ENCOUNTER — Other Ambulatory Visit: Payer: Self-pay | Admitting: Obstetrics & Gynecology

## 2015-09-07 ENCOUNTER — Ambulatory Visit (INDEPENDENT_AMBULATORY_CARE_PROVIDER_SITE_OTHER): Payer: Self-pay | Admitting: Obstetrics and Gynecology

## 2015-09-07 DIAGNOSIS — O24913 Unspecified diabetes mellitus in pregnancy, third trimester: Secondary | ICD-10-CM

## 2015-09-07 DIAGNOSIS — O24419 Gestational diabetes mellitus in pregnancy, unspecified control: Secondary | ICD-10-CM

## 2015-09-07 DIAGNOSIS — O24313 Unspecified pre-existing diabetes mellitus in pregnancy, third trimester: Secondary | ICD-10-CM

## 2015-09-07 LAB — POCT URINALYSIS DIP (DEVICE)
BILIRUBIN URINE: NEGATIVE
GLUCOSE, UA: NEGATIVE mg/dL
KETONES UR: NEGATIVE mg/dL
Leukocytes, UA: NEGATIVE
Nitrite: NEGATIVE
Protein, ur: NEGATIVE mg/dL
Specific Gravity, Urine: 1.01 (ref 1.005–1.030)
Urobilinogen, UA: 0.2 mg/dL (ref 0.0–1.0)
pH: 6.5 (ref 5.0–8.0)

## 2015-09-07 MED ORDER — INSULIN REGULAR HUMAN 100 UNIT/ML IJ SOLN: INTRAMUSCULAR | 11 refills | Status: DC

## 2015-09-07 NOTE — Progress Notes (Signed)
Pt reports one episode of bleeding on 7/31 when she used the bathroom.  [place] for growth and BPP done 7/28.  C/S scheduled on 8/13.

## 2015-09-07 NOTE — Progress Notes (Signed)
Subjective:  Lindsay Morrison is a 30 y.o. G3P2002 at [redacted]w[redacted]d being seen today for ongoing prenatal care.  She is currently monitored for the following issues for this high-risk pregnancy and has Diabetes mellitus affecting pregnancy, antepartum; Hypertension; Previous cesarean delivery, delivered; Supervision of other high-risk pregnancy; Hypertension affecting pregnancy; Obesity affecting pregnancy, antepartum; History of macrosomia in infant in prior pregnancy, currently pregnant; Fetal macrosomia during pregnancy, antepartum; and Group B Streptococcus carrier, antepartum on her problem list.  Patient reports bleeding. Pt reports she had 1 episode of bleeding on Monday. She has had minimal spotting of dried blood since. She denies anything in vagina prior to bleeding. She reports regular fetal movement.   Contractions: Not present. Vag. Bleeding: Moderate.  Movement: Present. Denies leaking of fluid.   The following portions of the patient's history were reviewed and updated as appropriate: allergies, current medications, past family history, past medical history, past social history, past surgical history and problem list. Problem list updated.  Objective:   Vitals:   09/07/15 0858  BP: 118/61  Pulse: 94  Weight: 197 lb (89.4 kg)    Fetal Status: Fetal Heart Rate (bpm): NST   reactive  Movement: Present     General:  Alert, oriented and cooperative. Patient is in no acute distress.  Skin: Skin is warm and dry. No rash noted.   Cardiovascular: Normal heart rate noted  Respiratory: Normal respiratory effort, no problems with respiration noted  Abdomen: Soft, gravid, appropriate for gestational age. Pain/Pressure: Absent     Pelvic:  Cervical exam deferred        Extremities: Normal range of motion.  Edema: None  Mental Status: Normal mood and affect. Normal behavior. Normal judgment and thought content.   Urinalysis: Urine Protein: Negative Urine Glucose: Negative  Assessment and Plan:   Pregnancy: G3P2002 at [redacted]w[redacted]d  1. Diabetes mellitus affecting pregnancy in third trimester CBG less than 90 in AM 2 hours after breakfast and lunch < 100-125 2 hours after dinner 120-145  -Pt can continue to take NPH 49 units [place] before breakfast and 27 units [place] before bedtime - insulin regular (HUMULIN R) 100 units/mL injection; Take 29 units [place] before breakfast and 30 units  before dinner (increased from 27)  Dispense: 10 mL; Refill: 11  -pt is currently scheduled for c-section on august 13th.  -fetal macrosomia was noted on most recent US.   Term labor symptoms and general obstetric precautions including but not limited to vaginal bleeding, contractions, leaking of fluid and fetal movement were reviewed in detail with the patient. Please refer to After Visit Summary for other counseling recommendations.  Return in about 4 days (around 09/11/2015), or needs twice weekly NST, MD in 1 week.   Lorne Skeens, MD

## 2015-09-07 NOTE — Patient Instructions (Signed)

## 2015-09-08 ENCOUNTER — Encounter (HOSPITAL_COMMUNITY): Payer: Self-pay

## 2015-09-08 ENCOUNTER — Telehealth (HOSPITAL_COMMUNITY): Payer: Self-pay | Admitting: *Deleted

## 2015-09-08 NOTE — Telephone Encounter (Signed)
Preadmission screen Interpreter number [phone]

## 2015-09-13 ENCOUNTER — Other Ambulatory Visit: Payer: Self-pay

## 2015-09-15 ENCOUNTER — Encounter (HOSPITAL_COMMUNITY)
Admission: RE | Admit: 2015-09-15 | Discharge: 2015-09-15 | Disposition: A | Discharge status: Home / Self Care | Payer: Medicaid Other | Source: Ambulatory Visit

## 2015-09-16 ENCOUNTER — Encounter (HOSPITAL_COMMUNITY)
Admission: RE | Admit: 2015-09-16 | Discharge: 2015-09-16 | Disposition: A | Discharge status: Home / Self Care | Payer: Medicaid Other | Source: Ambulatory Visit | Attending: Obstetrics & Gynecology | Admitting: Obstetrics & Gynecology

## 2015-09-16 ENCOUNTER — Ambulatory Visit (INDEPENDENT_AMBULATORY_CARE_PROVIDER_SITE_OTHER): Payer: Self-pay | Admitting: Obstetrics & Gynecology

## 2015-09-16 VITALS — BP 105/63 | HR 89 | Wt 197.6 lb

## 2015-09-16 DIAGNOSIS — O24313 Unspecified pre-existing diabetes mellitus in pregnancy, third trimester: Secondary | ICD-10-CM

## 2015-09-16 DIAGNOSIS — O24913 Unspecified diabetes mellitus in pregnancy, third trimester: Secondary | ICD-10-CM

## 2015-09-16 DIAGNOSIS — O09893 Supervision of other high risk pregnancies, third trimester: Secondary | ICD-10-CM

## 2015-09-16 DIAGNOSIS — Z36 Encounter for antenatal screening of mother: Secondary | ICD-10-CM

## 2015-09-16 DIAGNOSIS — O163 Unspecified maternal hypertension, third trimester: Secondary | ICD-10-CM

## 2015-09-16 DIAGNOSIS — O24919 Unspecified diabetes mellitus in pregnancy, unspecified trimester: Secondary | ICD-10-CM

## 2015-09-16 LAB — POCT URINALYSIS DIP (DEVICE)
BILIRUBIN URINE: NEGATIVE
Glucose, UA: NEGATIVE mg/dL
KETONES UR: NEGATIVE mg/dL
Nitrite: NEGATIVE
PH: 7 (ref 5.0–8.0)
PROTEIN: NEGATIVE mg/dL
SPECIFIC GRAVITY, URINE: 1.02 (ref 1.005–1.030)
Urobilinogen, UA: 0.2 mg/dL (ref 0.0–1.0)

## 2015-09-16 LAB — TYPE AND SCREEN
ABO/RH(D): O POS
Antibody Screen: NEGATIVE

## 2015-09-16 LAB — CBC
HEMATOCRIT: 37.5 % (ref 36.0–46.0)
Hemoglobin: 13.2 g/dL (ref 12.0–15.0)
MCH: 32.8 pg (ref 26.0–34.0)
MCHC: 35.2 g/dL (ref 30.0–36.0)
MCV: 93.1 fL (ref 78.0–100.0)
Platelets: 195 10*3/uL (ref 150–400)
RBC: 4.03 MIL/uL (ref 3.87–5.11)
RDW: 14.3 % (ref 11.5–15.5)
WBC: 8.2 10*3/uL (ref 4.0–10.5)

## 2015-09-16 LAB — ABO/RH: ABO/RH(D): O POS

## 2015-09-16 LAB — BASIC METABOLIC PANEL
ANION GAP: 9 (ref 5–15)
BUN: 6 mg/dL (ref 6–20)
CO2: 19 mmol/L — AB (ref 22–32)
Calcium: 9.2 mg/dL (ref 8.9–10.3)
Chloride: 107 mmol/L (ref 101–111)
Creatinine, Ser: 0.52 mg/dL (ref 0.44–1.00)
GFR calc Af Amer: >60 mL/min (ref >60)
GFR calc non Af Amer: >60 mL/min (ref >60)
GLUCOSE: 86 mg/dL (ref 65–99)
POTASSIUM: 3.7 mmol/L (ref 3.5–5.1)
Sodium: 135 mmol/L (ref 135–145)

## 2015-09-16 NOTE — Progress Notes (Signed)
NST reactive    Subjective:  Lindsay Morrison is a 30 y.o. G3P2002 at 216w0d being seen today for ongoing prenatal care.  She is currently monitored for the following issues for this high-risk pregnancy and has Diabetes mellitus affecting pregnancy, antepartum; Hypertension; Previous cesarean delivery, delivered; Supervision of other high-risk pregnancy; Hypertension affecting pregnancy; Obesity affecting pregnancy, antepartum; History of macrosomia in infant in prior pregnancy, currently pregnant; Fetal macrosomia during pregnancy, antepartum; and Group B Streptococcus carrier, antepartum on her problem list.  Patient reports no complaints.  Contractions: Not present. Vag. Bleeding: None.  Movement: Present. Denies leaking of fluid.   The following portions of the patient's history were reviewed and updated as appropriate: allergies, current medications, past family history, past medical history, past social history, past surgical history and problem list. Problem list updated.  Objective:   Vitals:   09/16/15 0916  BP: 105/63  Pulse: 89  Weight: 197 lb 9.6 oz (89.6 kg)    Fetal Status: Fetal Heart Rate (bpm): nst   Movement: Present  Presentation: Vertex  General:  Alert, oriented and cooperative. Patient is in no acute distress.  Skin: Skin is warm and dry. No rash noted.   Cardiovascular: Normal heart rate noted  Respiratory: Normal respiratory effort, no problems with respiration noted  Abdomen: Soft, gravid, appropriate for gestational age. Pain/Pressure: Absent     Pelvic:  Cervical exam deferred        Extremities: Normal range of motion.  Edema: None  Mental Status: Normal mood and affect. Normal behavior. Normal judgment and thought content.   Urinalysis:      Assessment and Plan:  Pregnancy: G3P2002 at 456w0d  1. Diabetes mellitus affecting pregnancy in third trimester NST reactive - Amniotic fluid index with NST  2. Hypertension affecting pregnancy, third  trimester Normal BP today - Amniotic fluid index with NST  3. Supervision of other high-risk pregnancy, third trimester   4. Diabetes mellitus affecting pregnancy, antepartum Good control noted no change to insulin EFW>90%ile Term labor symptoms and general obstetric precautions including but not limited to vaginal bleeding, contractions, leaking of fluid and fetal movement were reviewed in detail with the patient. Please refer to After Visit Summary for other counseling recommendations.  Return in about 6 weeks (around 10/28/2015) for PP visit.  C/S on 8/13. Plans BTL  Adam PhenixJames G Adilynne Fitzwater, MD

## 2015-09-16 NOTE — Patient Instructions (Addendum)
Instrucciones Para Antes de la Ciruga   Su ciruga est programada para 09/18/2015  Eusebio MeEntre por [facility]missiones de [facility]  a las [address] de la [place]        Por favor llame al [phone] si tiene algn problema en la maana de la ciruga                    Recuerde: (Remember)  No coma alimentos.No come despues a la media   No tome lquidos claros. No bebe despues a la media.    No use joyas, maquillaje de ojos, lpiz labial, crema para el cuerpo o esmalte de uas oscuro. (Do not wear jewelry, eye makeup, lipstick, body lotion, or dark fingernail polish). Puede usar desodorante (you may wear deodorant)    No se afeite 48 horas de su ciruga. (Do not shave 48 hours before your surgery)    No traiga objetos de valor al hospital.  [facility] no se hace responsable de ninguna pertenencia, ni objetos de valor que haya trado al hospital. (Do not bring valuable to the hospital.  [facility] is not responsible for any belongings or valuables brought to the hospital)   [facility] estas medicinas en la maana de la ciruga con un SORBITO de agua nada  . QINCE UNIDAS DE INSULINA ANTES DE DORMIR SABADO NOCHE.   Durante la ciruga no se pueden usar lentes de contacto, dentaduras o puentes. (Contacts, dentures or bridgework cannot be worn in surgery).   Si va a ser ingresado despus de la ciruga, deje la AMR Corporationmaleta en el carro hasta que se le haya asignado una habitacin. (If you are to be admitted after surgery, leave suitcase in car until your room has been assigned.)   A los pacientes que se les d de alta el mismo da no se les permitir manejar a casa.  (Patients discharged on the day of surgery will not be allowed to drive home)    [place] y nmero de telfono del Programmer[employer] multimedi[employer]ductor NA (Name and telephone number of your driver)   Instrucciones especiales N/A (Special Instructions)   Por favor, lea las hojas informativas que le entregaron. (Please  read over the following fact sheets that you were given) Surgical Site Infection Prevention

## 2015-09-16 NOTE — Pre-Procedure Instructions (Signed)
INTERPRETER NUMBER [phone]

## 2015-09-17 LAB — RPR: RPR: NONREACTIVE

## 2015-09-17 MED ORDER — GENTAMICIN SULFATE 40 MG/ML IJ SOLN
INTRAVENOUS | Status: AC
  Administered 2015-09-18: 100 mL via INTRAVENOUS
  Filled 2015-09-17: qty 8.75

## 2015-09-18 ENCOUNTER — Encounter (HOSPITAL_COMMUNITY): Payer: Self-pay | Admitting: Anesthesiology

## 2015-09-18 ENCOUNTER — Inpatient Hospital Stay (HOSPITAL_COMMUNITY): Payer: Medicaid Other | Admitting: Certified Registered Nurse Anesthetist

## 2015-09-18 ENCOUNTER — Inpatient Hospital Stay (HOSPITAL_COMMUNITY)
Admission: RE | Admit: 2015-09-18 | Discharge: 2015-09-20 | DRG: 765 | Disposition: A | Discharge status: Home / Self Care | Payer: Medicaid Other | Source: Ambulatory Visit | Attending: Obstetrics & Gynecology | Admitting: Obstetrics & Gynecology

## 2015-09-18 ENCOUNTER — Encounter (HOSPITAL_COMMUNITY)
Admission: RE | Disposition: A | Discharge status: Home / Self Care | Payer: Self-pay | Source: Ambulatory Visit | Attending: Obstetrics & Gynecology

## 2015-09-18 DIAGNOSIS — O99824 Streptococcus B carrier state complicating childbirth: Secondary | ICD-10-CM | POA: Diagnosis present

## 2015-09-18 DIAGNOSIS — O1092 Unspecified pre-existing hypertension complicating childbirth: Secondary | ICD-10-CM | POA: Diagnosis not present

## 2015-09-18 DIAGNOSIS — Z794 Long term (current) use of insulin: Secondary | ICD-10-CM

## 2015-09-18 DIAGNOSIS — O99213 Obesity complicating pregnancy, third trimester: Secondary | ICD-10-CM

## 2015-09-18 DIAGNOSIS — O2412 Pre-existing diabetes mellitus, type 2, in childbirth: Secondary | ICD-10-CM | POA: Diagnosis present

## 2015-09-18 DIAGNOSIS — Z833 Family history of diabetes mellitus: Secondary | ICD-10-CM

## 2015-09-18 DIAGNOSIS — E119 Type 2 diabetes mellitus without complications: Secondary | ICD-10-CM | POA: Diagnosis present

## 2015-09-18 DIAGNOSIS — O34211 Maternal care for low transverse scar from previous cesarean delivery: Principal | ICD-10-CM | POA: Diagnosis present

## 2015-09-18 DIAGNOSIS — Z3A39 39 weeks gestation of pregnancy: Secondary | ICD-10-CM

## 2015-09-18 DIAGNOSIS — O3663X1 Maternal care for excessive fetal growth, third trimester, fetus 1: Secondary | ICD-10-CM

## 2015-09-18 DIAGNOSIS — Z98891 History of uterine scar from previous surgery: Secondary | ICD-10-CM

## 2015-09-18 DIAGNOSIS — O3663X0 Maternal care for excessive fetal growth, third trimester, not applicable or unspecified: Secondary | ICD-10-CM | POA: Diagnosis present

## 2015-09-18 DIAGNOSIS — O99820 Streptococcus B carrier state complicating pregnancy: Secondary | ICD-10-CM

## 2015-09-18 DIAGNOSIS — O24919 Unspecified diabetes mellitus in pregnancy, unspecified trimester: Secondary | ICD-10-CM

## 2015-09-18 DIAGNOSIS — O134 Gestational [pregnancy-induced] hypertension without significant proteinuria, complicating childbirth: Secondary | ICD-10-CM | POA: Diagnosis present

## 2015-09-18 DIAGNOSIS — O163 Unspecified maternal hypertension, third trimester: Secondary | ICD-10-CM

## 2015-09-18 DIAGNOSIS — O09293 Supervision of pregnancy with other poor reproductive or obstetric history, third trimester: Secondary | ICD-10-CM

## 2015-09-18 DIAGNOSIS — O09893 Supervision of other high risk pregnancies, third trimester: Secondary | ICD-10-CM

## 2015-09-18 LAB — GLUCOSE, CAPILLARY: GLUCOSE-CAPILLARY: 91 mg/dL (ref 65–99)

## 2015-09-18 SURGERY — Surgical Case
Anesthesia: Spinal | Wound class: Clean Contaminated

## 2015-09-18 MED ORDER — NALBUPHINE HCL 10 MG/ML IJ SOLN: 5.0000 mg | INTRAMUSCULAR | Status: DC | PRN

## 2015-09-18 MED ORDER — BUPIVACAINE HCL (PF) 0.5 % IJ SOLN
INTRAMUSCULAR | Status: AC
  Filled 2015-09-18: qty 30

## 2015-09-18 MED ORDER — DIBUCAINE 1 % RE OINT: 1.0000 "application " | TOPICAL_OINTMENT | RECTAL | Status: DC | PRN

## 2015-09-18 MED ORDER — ZOLPIDEM TARTRATE 5 MG PO TABS: 5.0000 mg | ORAL_TABLET | Freq: Every evening | ORAL | Status: DC | PRN

## 2015-09-18 MED ORDER — OXYTOCIN 40 UNITS IN LACTATED RINGERS INFUSION - SIMPLE MED
INTRAVENOUS | Status: AC
  Filled 2015-09-18: qty 1000

## 2015-09-18 MED ORDER — MORPHINE SULFATE-NACL 0.5-0.9 MG/ML-% IV SOSY
PREFILLED_SYRINGE | INTRAVENOUS | Status: AC
  Filled 2015-09-18: qty 1

## 2015-09-18 MED ORDER — MEPERIDINE HCL 25 MG/ML IJ SOLN: 6.2500 mg | INTRAMUSCULAR | Status: DC | PRN

## 2015-09-18 MED ORDER — DIPHENHYDRAMINE HCL 25 MG PO CAPS: 25.0000 mg | ORAL_CAPSULE | Freq: Four times a day (QID) | ORAL | Status: DC | PRN

## 2015-09-18 MED ORDER — KETOROLAC TROMETHAMINE 30 MG/ML IJ SOLN
INTRAMUSCULAR | Status: AC
  Filled 2015-09-18: qty 1

## 2015-09-18 MED ORDER — COCONUT OIL OIL: 1.0000 "application " | TOPICAL_OIL | Status: DC | PRN

## 2015-09-18 MED ORDER — FENTANYL CITRATE (PF) 100 MCG/2ML IJ SOLN
INTRAMUSCULAR | Status: AC
  Filled 2015-09-18: qty 2

## 2015-09-18 MED ORDER — OXYCODONE-ACETAMINOPHEN 5-325 MG PO TABS: 2.0000 | ORAL_TABLET | ORAL | Status: DC | PRN

## 2015-09-18 MED ORDER — SODIUM CHLORIDE 0.9% FLUSH: 3.0000 mL | INTRAVENOUS | Status: DC | PRN

## 2015-09-18 MED ORDER — ACETAMINOPHEN 500 MG PO TABS
1000.0000 mg | ORAL_TABLET | Freq: Four times a day (QID) | ORAL | Status: AC
  Administered 2015-09-19 (×2): 1000 mg via ORAL
  Filled 2015-09-18 (×2): qty 2

## 2015-09-18 MED ORDER — ONDANSETRON HCL 4 MG/2ML IJ SOLN
INTRAMUSCULAR | Status: DC | PRN
  Administered 2015-09-18: 4 mg via INTRAVENOUS

## 2015-09-18 MED ORDER — KETOROLAC TROMETHAMINE 30 MG/ML IJ SOLN
30.0000 mg | Freq: Four times a day (QID) | INTRAMUSCULAR | Status: AC | PRN
  Administered 2015-09-18: 30 mg via INTRAMUSCULAR

## 2015-09-18 MED ORDER — DIPHENHYDRAMINE HCL 50 MG/ML IJ SOLN: 12.5000 mg | INTRAMUSCULAR | Status: DC | PRN

## 2015-09-18 MED ORDER — SCOPOLAMINE 1 MG/3DAYS TD PT72
MEDICATED_PATCH | TRANSDERMAL | Status: AC
  Filled 2015-09-18: qty 1

## 2015-09-18 MED ORDER — HYDROMORPHONE HCL 1 MG/ML IJ SOLN: 0.2500 mg | INTRAMUSCULAR | Status: DC | PRN

## 2015-09-18 MED ORDER — OXYCODONE-ACETAMINOPHEN 5-325 MG PO TABS
1.0000 | ORAL_TABLET | ORAL | Status: DC | PRN
  Administered 2015-09-19 – 2015-09-20 (×4): 1 via ORAL
  Filled 2015-09-18 (×4): qty 1

## 2015-09-18 MED ORDER — DEXAMETHASONE SODIUM PHOSPHATE 10 MG/ML IJ SOLN
INTRAMUSCULAR | Status: AC
  Filled 2015-09-18: qty 1

## 2015-09-18 MED ORDER — MENTHOL 3 MG MT LOZG: 1.0000 | LOZENGE | OROMUCOSAL | Status: DC | PRN

## 2015-09-18 MED ORDER — NALBUPHINE HCL 10 MG/ML IJ SOLN: 5.0000 mg | Freq: Once | INTRAMUSCULAR | Status: DC | PRN

## 2015-09-18 MED ORDER — KETOROLAC TROMETHAMINE 30 MG/ML IJ SOLN: 30.0000 mg | Freq: Four times a day (QID) | INTRAMUSCULAR | Status: AC | PRN

## 2015-09-18 MED ORDER — SOD CITRATE-CITRIC ACID 500-334 MG/5ML PO SOLN
30.0000 mL | Freq: Once | ORAL | Status: AC
  Administered 2015-09-18: 30 mL via ORAL

## 2015-09-18 MED ORDER — BUPIVACAINE IN DEXTROSE 0.75-8.25 % IT SOLN
INTRATHECAL | Status: DC | PRN
  Administered 2015-09-18: 1.6 mL via INTRATHECAL

## 2015-09-18 MED ORDER — MORPHINE SULFATE (PF) 0.5 MG/ML IJ SOLN
INTRAMUSCULAR | Status: DC | PRN
  Administered 2015-09-18: .2 mg via INTRATHECAL

## 2015-09-18 MED ORDER — OXYTOCIN 10 UNIT/ML IJ SOLN
INTRAVENOUS | Status: DC | PRN
  Administered 2015-09-18: 40 [IU] via INTRAVENOUS

## 2015-09-18 MED ORDER — SENNOSIDES-DOCUSATE SODIUM 8.6-50 MG PO TABS
2.0000 | ORAL_TABLET | ORAL | Status: DC
  Administered 2015-09-19 (×2): 2 via ORAL
  Filled 2015-09-18 (×2): qty 2

## 2015-09-18 MED ORDER — NALOXONE HCL 0.4 MG/ML IJ SOLN: 0.4000 mg | INTRAMUSCULAR | Status: DC | PRN

## 2015-09-18 MED ORDER — ACETAMINOPHEN 325 MG PO TABS: 650.0000 mg | ORAL_TABLET | ORAL | Status: DC | PRN

## 2015-09-18 MED ORDER — PHENYLEPHRINE 8 MG IN D5W 100 ML (0.08MG/ML) PREMIX OPTIME
INJECTION | INTRAVENOUS | Status: DC | PRN
  Administered 2015-09-18: 60 ug/min via INTRAVENOUS

## 2015-09-18 MED ORDER — IBUPROFEN 600 MG PO TABS: 600.0000 mg | ORAL_TABLET | Freq: Four times a day (QID) | ORAL | Status: DC | PRN

## 2015-09-18 MED ORDER — LACTATED RINGERS IV SOLN
INTRAVENOUS | Status: DC | PRN
  Administered 2015-09-18: 10:00:00 via INTRAVENOUS

## 2015-09-18 MED ORDER — OXYTOCIN 40 UNITS IN LACTATED RINGERS INFUSION - SIMPLE MED
2.5000 [IU]/h | INTRAVENOUS | Status: AC
  Administered 2015-09-18: 2.5 [IU]/h via INTRAVENOUS

## 2015-09-18 MED ORDER — LACTATED RINGERS IV SOLN: INTRAVENOUS | Status: DC

## 2015-09-18 MED ORDER — NALOXONE HCL 2 MG/2ML IJ SOSY
1.0000 ug/kg/h | PREFILLED_SYRINGE | INTRAMUSCULAR | Status: DC | PRN
  Filled 2015-09-18: qty 2

## 2015-09-18 MED ORDER — DIPHENHYDRAMINE HCL 25 MG PO CAPS: 25.0000 mg | ORAL_CAPSULE | ORAL | Status: DC | PRN

## 2015-09-18 MED ORDER — KETOROLAC TROMETHAMINE 30 MG/ML IJ SOLN: 30.0000 mg | Freq: Once | INTRAMUSCULAR | Status: DC

## 2015-09-18 MED ORDER — BUPIVACAINE HCL (PF) 0.5 % IJ SOLN
INTRAMUSCULAR | Status: DC | PRN
  Administered 2015-09-18: 30 mL

## 2015-09-18 MED ORDER — SCOPOLAMINE 1 MG/3DAYS TD PT72
MEDICATED_PATCH | TRANSDERMAL | Status: DC | PRN
  Administered 2015-09-18: 1 via TRANSDERMAL

## 2015-09-18 MED ORDER — IBUPROFEN 600 MG PO TABS
600.0000 mg | ORAL_TABLET | Freq: Four times a day (QID) | ORAL | Status: DC
  Administered 2015-09-18 – 2015-09-20 (×7): 600 mg via ORAL
  Filled 2015-09-18 (×7): qty 1

## 2015-09-18 MED ORDER — MAGNESIUM HYDROXIDE 400 MG/5ML PO SUSP: 30.0000 mL | ORAL | Status: DC | PRN

## 2015-09-18 MED ORDER — OXYTOCIN 10 UNIT/ML IJ SOLN
INTRAMUSCULAR | Status: AC
  Filled 2015-09-18: qty 4

## 2015-09-18 MED ORDER — LACTATED RINGERS IV SOLN
INTRAVENOUS | Status: DC | PRN
  Administered 2015-09-18 (×3): via INTRAVENOUS

## 2015-09-18 MED ORDER — TETANUS-DIPHTH-ACELL PERTUSSIS 5-2.5-18.5 LF-MCG/0.5 IM SUSP: 0.5000 mL | Freq: Once | INTRAMUSCULAR | Status: DC

## 2015-09-18 MED ORDER — FERROUS SULFATE 325 (65 FE) MG PO TABS
325.0000 mg | ORAL_TABLET | Freq: Two times a day (BID) | ORAL | Status: DC
  Administered 2015-09-18 – 2015-09-20 (×4): 325 mg via ORAL
  Filled 2015-09-18 (×4): qty 1

## 2015-09-18 MED ORDER — FENTANYL CITRATE (PF) 100 MCG/2ML IJ SOLN
INTRAMUSCULAR | Status: DC | PRN
  Administered 2015-09-18: 20 ug via INTRATHECAL

## 2015-09-18 MED ORDER — PROMETHAZINE HCL 25 MG/ML IJ SOLN: 6.2500 mg | INTRAMUSCULAR | Status: DC | PRN

## 2015-09-18 MED ORDER — PHENYLEPHRINE 8 MG IN D5W 100 ML (0.08MG/ML) PREMIX OPTIME
INJECTION | INTRAVENOUS | Status: AC
  Filled 2015-09-18: qty 100

## 2015-09-18 MED ORDER — SODIUM CHLORIDE 0.9 % IR SOLN
Status: DC | PRN
  Administered 2015-09-18: 200 mL

## 2015-09-18 MED ORDER — ONDANSETRON HCL 4 MG/2ML IJ SOLN: 4.0000 mg | Freq: Three times a day (TID) | INTRAMUSCULAR | Status: DC | PRN

## 2015-09-18 MED ORDER — PRENATAL MULTIVITAMIN CH
1.0000 | ORAL_TABLET | Freq: Every day | ORAL | Status: DC
  Administered 2015-09-19: 1 via ORAL
  Filled 2015-09-18: qty 1

## 2015-09-18 MED ORDER — SIMETHICONE 80 MG PO CHEW
80.0000 mg | CHEWABLE_TABLET | ORAL | Status: DC
  Administered 2015-09-19 (×2): 80 mg via ORAL
  Filled 2015-09-18 (×2): qty 1

## 2015-09-18 MED ORDER — SCOPOLAMINE 1 MG/3DAYS TD PT72
1.0000 | MEDICATED_PATCH | Freq: Once | TRANSDERMAL | Status: DC
  Filled 2015-09-18: qty 1

## 2015-09-18 MED ORDER — WITCH HAZEL-GLYCERIN EX PADS: 1.0000 "application " | MEDICATED_PAD | CUTANEOUS | Status: DC | PRN

## 2015-09-18 MED ORDER — SIMETHICONE 80 MG PO CHEW: 80.0000 mg | CHEWABLE_TABLET | ORAL | Status: DC | PRN

## 2015-09-18 MED ORDER — MEASLES, MUMPS & RUBELLA VAC ~~LOC~~ INJ
0.5000 mL | INJECTION | Freq: Once | SUBCUTANEOUS | Status: DC
  Filled 2015-09-18: qty 0.5

## 2015-09-18 MED ORDER — ONDANSETRON HCL 4 MG/2ML IJ SOLN
INTRAMUSCULAR | Status: AC
  Filled 2015-09-18: qty 2

## 2015-09-18 MED ORDER — SOD CITRATE-CITRIC ACID 500-334 MG/5ML PO SOLN
ORAL | Status: AC
  Administered 2015-09-18: 30 mL via ORAL
  Filled 2015-09-18: qty 15

## 2015-09-18 SURGICAL SUPPLY — 30 items
CHLORAPREP W/TINT 26ML (MISCELLANEOUS) ×3 IMPLANT
CLAMP CORD UMBIL (MISCELLANEOUS) IMPLANT
CLOTH BEACON ORANGE TIMEOUT ST (SAFETY) ×3 IMPLANT
DRSG OPSITE POSTOP 4X10 (GAUZE/BANDAGES/DRESSINGS) ×3 IMPLANT
ELECT REM PT RETURN 9FT ADLT (ELECTROSURGICAL) ×3
ELECTRODE REM PT RTRN 9FT ADLT (ELECTROSURGICAL) ×1 IMPLANT
EXTRACTOR VACUUM M CUP 4 TUBE (SUCTIONS) ×2 IMPLANT
EXTRACTOR VACUUM M CUP 4' TUBE (SUCTIONS) ×1
GLOVE BIOGEL PI IND STRL 7.0 (GLOVE) ×3 IMPLANT
GLOVE BIOGEL PI INDICATOR 7.0 (GLOVE) ×6
GLOVE ECLIPSE 7.0 STRL STRAW (GLOVE) ×3 IMPLANT
GOWN STRL REUS W/TWL LRG LVL3 (GOWN DISPOSABLE) ×6 IMPLANT
KIT ABG SYR 3ML LUER SLIP (SYRINGE) IMPLANT
NEEDLE HYPO 22GX1.5 SAFETY (NEEDLE) ×3 IMPLANT
NEEDLE HYPO 25X5/8 SAFETYGLIDE (NEEDLE) ×3 IMPLANT
NS IRRIG 1000ML POUR BTL (IV SOLUTION) ×3 IMPLANT
PACK C SECTION WH (CUSTOM PROCEDURE TRAY) ×3 IMPLANT
PAD ABD 7.5X8 STRL (GAUZE/BANDAGES/DRESSINGS) ×3 IMPLANT
PAD OB MATERNITY 4.3X12.25 (PERSONAL CARE ITEMS) ×3 IMPLANT
PENCIL SMOKE EVAC W/HOLSTER (ELECTROSURGICAL) ×3 IMPLANT
RTRCTR C-SECT PINK 25CM LRG (MISCELLANEOUS) IMPLANT
SPONGE GAUZE 4X4 12PLY STER LF (GAUZE/BANDAGES/DRESSINGS) ×3 IMPLANT
SUT PDS AB 0 CTX 36 PDP370T (SUTURE) ×3 IMPLANT
SUT PLAIN 2 0 XLH (SUTURE) IMPLANT
SUT VIC AB 0 CTX 36 (SUTURE) ×6
SUT VIC AB 0 CTX36XBRD ANBCTRL (SUTURE) ×3 IMPLANT
SUT VIC AB 4-0 KS 27 (SUTURE) ×3 IMPLANT
SYR CONTROL 10ML LL (SYRINGE) ×3 IMPLANT
TOWEL OR 17X24 6PK STRL BLUE (TOWEL DISPOSABLE) ×3 IMPLANT
TRAY FOLEY CATH SILVER 14FR (SET/KITS/TRAYS/PACK) ×3 IMPLANT

## 2015-09-18 NOTE — Addendum Note (Signed)
Addendum  created 09/18/15 1227 by Leilani AbleFranklin Kensie Susman, MD   Sign clinical note

## 2015-09-18 NOTE — Anesthesia Preprocedure Evaluation (Addendum)
Anesthesia Evaluation  Patient identified by MRN, date of birth, ID band Patient awake    Reviewed: Allergy & Precautions, H&P , NPO status , Patient's Chart, lab work & pertinent test results  Airway Mallampati: II  TM Distance: >3 FB Neck ROM: full    Dental no notable dental hx.    Pulmonary neg pulmonary ROS,    Pulmonary exam normal        Cardiovascular Normal cardiovascular exam     Neuro/Psych negative neurological ROS  negative psych ROS   GI/Hepatic negative GI ROS, Neg liver ROS,   Endo/Other  negative endocrine ROSdiabetes, Gestational, Insulin Dependent  Renal/GU negative Renal ROS     Musculoskeletal   Abdominal (+) + obese,   Peds  Hematology negative hematology ROS (+)   Anesthesia Other Findings   Reproductive/Obstetrics (+) Pregnancy                             Anesthesia Physical Anesthesia Plan  ASA: III  Anesthesia Plan: Spinal   Post-op Pain Management:    Induction:   Airway Management Planned:   Additional Equipment:   Intra-op Plan:   Post-operative Plan:   Informed Consent: I have reviewed the patients History and Physical, chart, labs and discussed the procedure including the risks, benefits and alternatives for the proposed anesthesia with the patient or authorized representative who has indicated his/her understanding and acceptance.     Plan Discussed with: Surgeon and CRNA  Anesthesia Plan Comments:        Anesthesia Quick Evaluation

## 2015-09-18 NOTE — Anesthesia Postprocedure Evaluation (Signed)
Anesthesia Post Note  Patient: Lindsay Morrison  Procedure(s) Performed: Procedure(s) (LRB): CESAREAN SECTION (N/A)  Patient location during evaluation: Mother Baby Anesthesia Type: Spinal Level of consciousness: awake Pain management: pain level controlled Vital Signs Assessment: post-procedure vital signs reviewed and stable Respiratory status: spontaneous breathing Cardiovascular status: stable Postop Assessment: no headache, no backache, spinal receding, patient able to bend at knees, no signs of nausea or vomiting and adequate PO intake Anesthetic complications: no     Last Vitals:  Vitals:   09/18/15 1336 09/18/15 1430  BP: 114/61 (!) 108/52  Pulse: 70 70  Resp: 18 18  Temp: 37 C 36.5 C    Last Pain:  Vitals:   09/18/15 1430  TempSrc: Oral  PainSc: 0-No pain   Pain Goal:                 Demaris Bousquet

## 2015-09-18 NOTE — Anesthesia Postprocedure Evaluation (Signed)
Anesthesia Post Note  Patient: Lindsay Morrison  Procedure(s) Performed: Procedure(s) (LRB): CESAREAN SECTION (N/A)  Patient location during evaluation: PACU Anesthesia Type: Spinal Level of consciousness: awake Pain management: pain level controlled Vital Signs Assessment: post-procedure vital signs reviewed and stable Respiratory status: spontaneous breathing Cardiovascular status: stable Postop Assessment: no headache, no backache, spinal receding, patient able to bend at knees and no signs of nausea or vomiting Anesthetic complications: no     Last Vitals:  Vitals:   09/18/15 1052 09/18/15 1105  BP: 110/64 (!) 103/53  Pulse: 77 71  Resp: 15 17  Temp: 36.3 C     Last Pain:  Vitals:   09/18/15 1052  TempSrc: Oral  PainSc: 0-No pain   Pain Goal:                 Teila Skalsky JR,JOHN Niccolas Loeper

## 2015-09-18 NOTE — Op Note (Signed)
[place] [place] PROCEDURE DATE: 09/18/2015  PREOPERATIVE DIAGNOSES: Intrauterine pregnancy at 434w2d weeks gestation; previous cesarean section x 2; Type II DM, Chronic Hypertension  POSTOPERATIVE DIAGNOSES: The same; macrosomic infant   PROCEDURE: Repeat Low Transverse Cesarean Section  SURGEON:  Dr. Jaynie CollinsUgonna Giorgia Wahler  ANESTHESIOLOGIST: Dr. Leilani AbleFranklin Hatchett  INDICATIONS: Lindsay Morrison is a 30 y.o. [id number] at 5134w2d here for repeat cesarean section secondary to the indications listed under preoperative diagnoses; please see preoperative note for further details.  The risks of cesarean section were discussed with the patient including but were not limited to: bleeding which may require transfusion or reoperation; infection which may require antibiotics; injury to bowel, bladder, ureters or other surrounding organs; injury to the fetus; need for additional procedures including hysterectomy in the event of a life-threatening hemorrhage; placental abnormalities wth subsequent pregnancies, incisional problems, thromboembolic phenomenon and other postoperative/anesthesia complications.   The patient concurred with the proposed plan, giving informed written consent for the procedure.    FINDINGS:  Viable female infant in cephalic presentation.  Apgars 9 and 9, preliminary weight 11 lb 4 oz.  Clear amniotic fluid.  Intact placenta, three vessel cord.  Normal uterus, fallopian tubes and ovaries bilaterally.  Vacuum assistance was required for delivery of fetal head.  ANESTHESIA: Spinal INTRAVENOUS FLUIDS: 2800 ml ESTIMATED BLOOD LOSS: 800 ml URINE OUTPUT:  500 ml SPECIMENS: Placenta sent to L&D COMPLICATIONS: None immediate  PROCEDURE IN DETAIL:  The patient preoperatively received intravenous antibiotics and had sequential compression devices applied to her lower extremities.  She was then taken to the operating room where spinal anesthesia was administered and was found to be adequate. She was then placed  in a dorsal supine position with a leftward tilt, and prepped and draped in a sterile manner.  A foley catheter was placed into her bladder and attached to constant gravity.  After an adequate timeout was performed, a Pfannenstiel skin incision was made with scalpel over her preexisting scar, and carried through to the underlying layer of fascia. The fascia was incised in the midline, and this incision was extended bilaterally using the Mayo scissors.  Kocher clamps were applied to the superior aspect of the fascial incision and the underlying rectus muscles were dissected off bluntly.  A similar process was carried out on the inferior aspect of the fascial incision. The rectus muscles were separated in the midline bluntly and the peritoneum was entered bluntly. Attention was turned to the lower uterine segment where a low transverse hysterotomy was made with a scalpel and extended bilaterally bluntly.  The infant was successfully delivered, the cord was clamped and cut after one minute, and the infant was handed over to the awaiting neonatology team. Of note, there was some difficulty with delivering infant; vacuum assistance was utilized.  Uterine massage was then administered, and the placenta delivered intact with a three-vessel cord. The uterus was then cleared of clot and debris.  The hysterotomy was closed with 0 Vicryl in a running locked fashion, and an imbricating layer was also placed with 0 Vicryl.  The pelvis was cleared of all clot and debris. Hemostasis was confirmed on all surfaces.  The fascia was then closed using 0 PDS in a running fashion.  The subcutaneous layer was irrigated, then reapproximated with 2-0 plain gut interrupted stitches, and 30 ml of 0.5% Marcaine was injected subcutaneously around the incision.  The skin was closed with a 4-0 Vicryl subcuticular stitch. The patient tolerated the procedure well. Sponge, lap, instrument and  needle counts were correct x 3.  She was taken to the  recovery room in stable condition.    Jaynie Collins, MD, FACOG Attending Obstetrician & Gynecologist Faculty Practice, [facility]

## 2015-09-18 NOTE — H&P (Signed)
Obstetric Preoperative History and Physical  Lindsay Morrison is a 30 y.o. [id number] with IUP at 21w2dpresenting for scheduled cesarean section.  No acute concerns.   Prenatal Course Source of Care: [facility] Pregnancy complications or risks: Patient Active Problem List   Diagnosis Date Noted  . Group B Streptococcus carrier, antepartum 09/03/2015  . Fetal macrosomia during pregnancy, antepartum 08/24/2015  . Hypertension affecting pregnancy 03/14/2015  . Obesity affecting pregnancy, antepartum 03/14/2015  . History of macrosomia in infant in prior pregnancy, currently pregnant 03/14/2015  . Supervision of other high-risk pregnancy 02/23/2015  . Previous cesarean delivery, delivered 05/16/2010  . Diabetes mellitus affecting pregnancy, antepartum   . Hypertension     [facility]  Dating  5wk3d [place] type: --/--/O POS, O POS (08/11 1230)   Genetic Screen Ordered, but did not get due to cost.   Antibody:NEG (08/11 1230)  Anatomic [place] wnl, female  [X] incomplete -> nml Rubella: 8.75 (02/06 0924)Immune  GTT  n/a Class B GDM RPR: Non Reactive (08/11 1230)  Flu vaccine  declined HBsAg: NEGATIVE (02/06 0924)   TDaP vaccine  given 06/27/15                          Rhogam:N/A  HIV: NONREACTIVE (05/22 1011)   Baby Food    Breast                                           GHLK:TGYBWLSL(07/24 0000)positive. Sensitive to clindamycin.   Contraception  Vasectomy Pap: NEG 02/22/2015-- no TZ. Needs follow up in 3 years  Circumcision  n/a female   Pediatrician RJustice(Husband)      Past Medical History:  Diagnosis Date  . Diabetes mellitus type 2 in [place] ([place] 05/2009   Began on Metformin 05/2009, patient stopped taking on her own 11/2009    Past Surgical History:  Procedure Laterality Date  . CESAREAN SECTION  2004   [place] scar  . CESAREAN SECTION  2012   Repeat cesarean delivery  . LAPAROSCOPIC CHOLECYSTECTOMY   09/09/2009   Dr.Jenkins    OB History  Gravida Para Term Preterm AB Living  3 2 2  0 0 2  SAB TAB Ectopic Multiple Live Births  0 0 0 0 2    # Outcome Date GA Lbr Len/2nd Weight Sex Delivery Anes PTL Lv  3 Current           2 Term 08/12/10 470w0d11 lb 4 oz (5.103 kg) F CS-LTranv  N LIV  1 Term 2004 4033w0d lb 8 oz (3.856 kg) M CS-Unspec   LIV     Birth Comments: unknown scar, states c/s because past 40wk, baby passed meconium      Social History   Social History  . Marital status: Married    Spouse name: N/A  . Number of children: 1  . Years of education: N/A   Occupational History  . unemployed Unemployed   Social History Main Topics  . Smoking status: Never Smoker  . Smokeless tobacco: Never Used  . Alcohol use No  . Drug use: No  . Sexual activity: Yes   Other Topics Concern  . None   Social History Narrative  . None  Family History  Problem Relation Age of Onset  . Diabetes Paternal Grandmother     Prescriptions Prior to Admission  Medication Sig Dispense Refill Last Dose  . aspirin EC 81 MG tablet Take 1 tablet (81 mg total) by mouth daily. Take after 12 weeks for prevention of preeclampsia later in pregnancy 300 tablet 2 Taking  . insulin NPH Human (HUMULIN N,NOVOLIN N) 100 UNIT/ML injection 49 units [place] before breakfast and 27 units [place] before bedtime 10 mL 3 Taking  . insulin regular (HUMULIN R) 100 units/mL injection Take 29 units [place] before breakfast and 30 units [place] before dinner 10 mL 11 Taking  . Prenatal Vit-Fe Fumarate-FA (MULTIVITAMIN-PRENATAL) 27-0.8 MG TABS tablet Take 1 tablet by mouth daily at 12 noon. 30 each 11 Taking    Allergies  Allergen Reactions  . Penicillins     Review of Systems: Negative except for what is mentioned in HPI.  Physical Exam: BP (!) 125/53 (BP Location: Right Arm)   Temp 98.3 F (36.8 C) (Oral)   Resp 16   Ht 5' 5"  (1.651 m)   Wt 198 lb (89.8 kg)   LMP 12/06/2014 (Exact Date)   BMI 32.95 kg/m  FHR   140 bpm, reactive NST CONSTITUTIONAL: Well-developed, well-nourished female in no acute distress.  HENT:  Normocephalic, atraumatic, External right and left ear normal. Oropharynx is clear and moist EYES: Conjunctivae and EOM are normal. Pupils are equal, round, and reactive to light. No scleral icterus.  NECK: Normal range of motion, supple, no masses SKIN: Skin is warm and dry. No rash noted. Not diaphoretic. No erythema. No pallor. [place]: Alert and oriented to person, place, and time. Normal reflexes, muscle tone coordination. No cranial nerve deficit noted. PSYCHIATRIC: Normal mood and affect. Normal behavior. Normal judgment and thought content. CARDIOVASCULAR: Normal heart rate noted, regular rhythm RESPIRATORY: Effort and breath sounds normal, no problems with respiration noted ABDOMEN: Soft, nontender, nondistended, gravid. Well-healed Pfannenstiel and vertical incision. PELVIC: Deferred MUSCULOSKELETAL: Normal range of motion. No edema and no tenderness. 2+ distal pulses.   Pertinent Labs/Studies:   Results for orders placed or performed during the hospital encounter of 09/16/15 (from the past 72 hour(s))  CBC     Status: None   Collection Time: 09/16/15 12:30 PM  Result Value Ref Range   WBC 8.2 4.0 - 10.5 K/uL   RBC 4.03 3.87 - 5.11 MIL/uL   Hemoglobin 13.2 12.0 - 15.0 g/dL   HCT 37.5 36.0 - 46.0 %   MCV 93.1 78.0 - 100.0 fL   MCH 32.8 26.0 - 34.0 pg   MCHC 35.2 30.0 - 36.0 g/dL   RDW 14.3 11.5 - 15.5 %   Platelets 195 150 - 400 K/uL  RPR     Status: None   Collection Time: 09/16/15 12:30 PM  Result Value Ref Range   RPR Ser Ql Non Reactive Non Reactive    Comment: (NOTE) Performed At: [facility] [place], [place] [id number] Lindon Romp MD FM:[id number]   Basic metabolic panel     Status: Abnormal   Collection Time: 09/16/15 12:30 PM  Result Value Ref Range   Sodium 135 135 - 145 mmol/L   Potassium 3.7 3.5 - 5.1 mmol/L   Chloride  107 101 - 111 mmol/L   CO2 19 (L) 22 - 32 mmol/L   Glucose, Bld 86 65 - 99 mg/dL   BUN 6 6 - 20 mg/dL   Creatinine, Ser 0.52 0.44 - 1.00 mg/dL  Calcium 9.2 8.9 - 10.3 mg/dL   GFR calc non Af Amer >60 >60 mL/min   GFR calc Af Amer >60 >60 mL/min    Comment: (NOTE) The eGFR has been calculated using the CKD EPI equation. This calculation has not been validated in all clinical situations. eGFR's persistently <60 mL/min signify possible Chronic Kidney Disease.    Anion gap 9 5 - 15  Type and screen     Status: None   Collection Time: 09/16/15 12:30 PM  Result Value Ref Range   ABO/RH(D) O POS    Antibody Screen NEG    Sample Expiration 09/19/2015   ABO/Rh     Status: None   Collection Time: 09/16/15 12:30 PM  Result Value Ref Range   ABO/RH(D) O POS   CBG 91  Assessment and Plan :Lindsay Morrison is a 29 y.o. G3P2002 at 19w2dbeing admitted for scheduled repeat cesarean section. The risks of cesarean section discussed with the patient included but were not limited to: bleeding which may require transfusion or reoperation; infection which may require antibiotics; injury to bowel, bladder, ureters or other surrounding organs; injury to the fetus; need for additional procedures including hysterectomy in the event of a life-threatening hemorrhage; placental abnormalities wth subsequent pregnancies, incisional problems, thromboembolic phenomenon and other postoperative/anesthesia complications. The patient concurred with the proposed plan, giving informed written consent for the procedure. Patient has been NPO since last night she will remain NPO for procedure. Anesthesia and OR aware. Preoperative prophylactic antibiotics and SCDs ordered on call to the OR. To OR when ready.    UVerita Schneiders MD, [place] Attending [place] [facility]

## 2015-09-18 NOTE — Anesthesia Procedure Notes (Signed)
Spinal  Patient location during procedure: OR Start time: 09/18/2015 9:34 AM End time: 09/18/2015 9:37 AM Staffing Anesthesiologist: Leilani AbleHATCHETT, [place] Performed: anesthesiologist  Preanesthetic Checklist Completed: patient identified, surgical consent, pre-op evaluation, timeout performed, IV checked, risks and benefits discussed and monitors and equipment checked Spinal Block Patient position: sitting Prep: site prepped and draped and DuraPrep Patient monitoring: heart rate, cardiac monitor, continuous pulse ox and blood pressure Approach: midline Location: L3-4 Injection technique: single-shot Needle Needle type: Sprotte  Needle gauge: 24 G Needle length: 9 cm Needle insertion depth: 5 cm Assessment Sensory level: T6

## 2015-09-18 NOTE — Addendum Note (Signed)
Addendum  created 09/18/15 1546 by Renford DillsJanet L Montina Dorrance, CRNA   Sign clinical note

## 2015-09-18 NOTE — Transfer of Care (Signed)
Immediate Anesthesia Transfer of Care Note  Patient: Lindsay Morrison  Procedure(s) Performed: Procedure(s): CESAREAN SECTION (N/A)  Patient Location: PACU  Anesthesia Type:Spinal  Level of Consciousness: awake, alert  and oriented  Airway & Oxygen Therapy: Patient Spontanous Breathing  Post-op Assessment: Report given to RN and Post -op Vital signs reviewed and stable  Post vital signs: Reviewed and stable  Last Vitals:  Vitals:   09/18/15 0829  BP: (!) 125/53  Resp: 16  Temp: 36.8 C    Last Pain:  Vitals:   09/18/15 0829  TempSrc: Oral         Complications: No apparent anesthesia complications

## 2015-09-19 LAB — CBC
HCT: 31.9 % — ABNORMAL LOW (ref 36.0–46.0)
Hemoglobin: 11.4 g/dL — ABNORMAL LOW (ref 12.0–15.0)
MCH: 33.1 pg (ref 26.0–34.0)
MCHC: 35.7 g/dL (ref 30.0–36.0)
MCV: 92.7 fL (ref 78.0–100.0)
PLATELETS: 173 10*3/uL (ref 150–400)
RBC: 3.44 MIL/uL — AB (ref 3.87–5.11)
RDW: 14.3 % (ref 11.5–15.5)
WBC: 8.8 10*3/uL (ref 4.0–10.5)

## 2015-09-19 LAB — GLUCOSE, CAPILLARY: Glucose-Capillary: 147 mg/dL — ABNORMAL HIGH (ref 65–99)

## 2015-09-19 NOTE — Progress Notes (Signed)
Subjective: Postpartum Day 1: Cesarean Delivery Patient reports tolerating PO, + flatus and no problems voiding.  Breast and bottle feeding.  Reports pain well controlled with meds.  Objective: Vital signs in last 24 hours: Temp:  [97.4 F (36.3 C)-99.5 F (37.5 C)] 98.6 F (37 C) (08/14 0500) Pulse Rate:  [60-94] 80 (08/14 0500) Resp:  [14-20] 20 (08/14 0500) BP: (89-125)/(40-88) 96/88 (08/14 0515) SpO2:  [96 %-100 %] 100 % (08/14 0500) Weight:  [198 lb (89.8 kg)] 198 lb (89.8 kg) (08/13 0829)  Physical Exam:  General: alert, cooperative and appears stated age CVS:  RRR, without murmur, gallops, or rubs Lungs:  CTA bilat ABD:  +BSx4, normal Lochia: appropriate Uterine Fundus: firm Incision: no significant drainage, dressing clean, dry, and intact DVT Evaluation: No evidence of DVT seen on physical exam. Negative Homan's sign.   Recent Labs  09/16/15 1230 09/19/15 0545  HGB 13.2 11.4*  HCT 37.5 31.9*    Assessment/Plan: Status post Cesarean section. Doing well postoperatively.  Continue current care.  Lindsay Morrison, Lindsay Morrison 09/19/2015, 7:45 AM

## 2015-09-19 NOTE — Progress Notes (Signed)
I assisted Frank.KiddWalidah CNM with questions.  Eda H Royal Interpreter.

## 2015-09-19 NOTE — Lactation Note (Signed)
This note was copied from a baby's chart. Lactation Consultation Note Mom had c/s d/t LGA 11.2lbs DM. Mom is BF and formula feeding. Interpreter present for consult. This is mom's third baby. Mom didn't BF her 1st child d/t she didn't have anything in her breast. Mom tried to BF her 2nd child but only had a little bit of milk and baby wouldn't take the breast. That 2nd child is now 31 yrs old. Hand expression taught to mom w/glistening of colostrum. Mom has wide space between breast, small areola, medium large length soft shaft w/square tip large nipple. Discussed post pumping for stimulation since has hx: of low to no milk supply.  Mom encouraged to feed baby 8-12 times/24 hours and with feeding cues. Discussed newborn behavior, I&O, STS, supply and demand, BF first before formula feeding. Referred to Baby and Me Book in Breastfeeding section Pg. 22-23 for position options and Proper latch demonstration. [place] brochure given w/resources, support groups and [place] services. DEBP and kit brought to rm. Mom had fallen to sleep. Asked RN to set up when awake.  Patient Name: Lindsay Morrison [id number] Date: 09/19/2015 Reason for consult: Initial assessment   Maternal Data Has patient been taught Hand Expression?: Yes Does the patient have breastfeeding experience prior to this delivery?: Yes  Feeding    LATCH Score/Interventions       Type of Nipple: Everted at rest and after stimulation  Comfort (Breast/Nipple): Soft / non-tender     Intervention(s): Breastfeeding basics reviewed;Support Pillows;Position options;Skin to skin     Lactation Tools Discussed/Used Tools: Pump Breast pump type: Double-Electric Breast Pump WIC Program: Yes Pump Review: Setup, frequency, and cleaning;Milk Storage Initiated by:: Allayne Stack RN IBCLC / S. Burns RN Date initiated:: 09/19/15   Consult Status Consult Status: Follow-up Date: 09/19/15 (in pm) Follow-up type: In-patient    Lindsay Morrison, Lindsay Morrison 09/19/2015, 2:20 AM

## 2015-09-19 NOTE — Progress Notes (Signed)
UR chart review completed.  

## 2015-09-20 ENCOUNTER — Encounter (HOSPITAL_COMMUNITY): Payer: Self-pay | Admitting: Obstetrics & Gynecology

## 2015-09-20 MED ORDER — OXYCODONE-ACETAMINOPHEN 5-325 MG PO TABS: 1.0000 | ORAL_TABLET | ORAL | 0 refills | Status: AC | PRN

## 2015-09-20 MED ORDER — INSULIN NPH (HUMAN) (ISOPHANE) 100 UNIT/ML ~~LOC~~ SUSP
10.0000 [IU] | Freq: Two times a day (BID) | SUBCUTANEOUS | Status: DC
  Administered 2015-09-20: 10 [IU] via SUBCUTANEOUS
  Filled 2015-09-20: qty 10

## 2015-09-20 MED ORDER — INSULIN NPH (HUMAN) (ISOPHANE) 100 UNIT/ML ~~LOC~~ SUSP: 10.0000 [IU] | Freq: Two times a day (BID) | SUBCUTANEOUS | 11 refills | Status: AC

## 2015-09-20 NOTE — Discharge Instructions (Signed)
Parto por cesrea - Cuidados posteriores  (Cesarean Delivery, Care After) Siga estas instrucciones durante las prximas semanas. Estas indicaciones le proporcionan informacin general acerca de cmo deber cuidarse despus del procedimiento. El mdico tambin podr darle instrucciones ms especficas. El tratamiento se ha planificado de acuerdo a las prcticas mdicas actuales, pero a veces se producen problemas. Comunquese con el mdico si tiene algn problema o tiene dudas cuando vuelva a su casa.  INSTRUCCIONES PARA EL CUIDADO EN EL HOGAR  Tome slo medicamentos de venta libre o recetados, segn las indicaciones del mdico.  No beba alcohol, especialmente si est amamantando o toma analgsicos.  Nomastique tabaco ni fume.  Contine con un adecuado cuidado perineal. El buen cuidado perineal incluye:  Higienizarse de adelante hacia atrs.  Mantener la zona perineal limpia.  Controlar diariamente el corte (incisin) y observar si aumenta el enrojecimiento, si supura, se hincha o se separa la piel.  Limpie la incisin suavemente con jabn y agua todos los das, y luego squela dando golpecitos. Si el mdico la autoriza, deje la incisin al descubierto. Use un apsito (vendaje) si drena lquido o la incisin parece irritada. Si las pequeas tiras [facility] que cruzan la incisin no se caen dentro de los 7 das, retrelas suavemente.  Abrace una almohada al toser o estornudar hasta que la incisin se cure. Esto ayuda a Best boy.  No conduzca vehculos ni opere maquinarias hasta que el mdico la autorice.  Dchese, lvese el cabello y tome baos de inmersin segn las indicaciones de su mdico.  Utilice un sostn que le ajuste bien y que brinde buen soporte a sus Glass blower/designer.  Limite el uso de bombachas de sostn o medias panty.  Beba suficiente lquido para Consulting civil engineer orina clara o de color amarillo plido.  Consuma todos los das alimentos ricos en fibra como cereales y panes  [place], arroz, frijoles y frutas frescas y verduras. Estos alimentos pueden ayudarla a prevenir o Cytogeneticist.  Reanude las actividades como subir escaleras, conducir automviles, levantar objetos pesados, hacer ejercicios o viajar cuando le indique su mdico.  Hable con su mdico acerca de reanudar la actividad sexual. Volver a la actividad sexual depende del riesgo de infeccin, la velocidad de la curacin y la comodidad y su deseo de Financial controller.  Trate de que alguien la ayude con las actividades del hogar y con el recin nacido al menos durante algunos das despus de salir del hospital.  Descanse todo lo que pueda. Trate de descansar o tomar una siesta mientras el beb est durmiendo.  Aumente sus actividades gradualmente.  Cumpla con todos los controles programados para despus del [place]. Es muy importante asistir a todas las visitas de Nurse, adult. En estas visitas, su mdico va a controlarla para asegurarse de que est sanando fsica y emocionalmente. SOLICITE ATENCIN MDICA SI:   Elimina cogulos grandes por la vagina. Guarde algunos cogulos para mostrarle al mdico.  Tiene una secrecin con feo olor que proviene de la vagina.  Tiene dificultad para orinar.  Orina con frecuencia.  Siente dolor al [employer].  Nota un cambio en sus movimientos intestinales.  Aumenta el enrojecimiento, el dolor o la hinchazn en la zona de la incisin.  Observa que supura pus en la incisin.  La incisin se abre.  Sus [employer] duelen, estn duras o enrojecidas.  Sufre un dolor intenso de [place].  Tiene visin borrosa o ve manchas.  Se siente triste o deprimida.  Tiene pensamientos acerca de lastimarse o daar al  recién nacido. °· Tiene preguntas acerca de su cuidado, la atención del recién nacido o acerca de los medicamentos. °· Se siente mareada o sufre un desmayo. °· Tiene una erupción. °· Siente dolor u observa enrojecimiento o hinchazón en el sitio en que  estaba la vía intravenosa (IV). °· Tiene náuseas o vómitos. °· Usted dejó de amamantar al bebé y no ha tenido su período menstrual dentro de las 12 semanas siguientes. °· No amamanta al bebé y no tuvo su período menstrual en las últimas 12 semanas. °· Tiene fiebre. °SOLICITE ATENCIÓN MÉDICA DE INMEDIATO SI:  °· Siente dolor persistente. °· Siente dolor en el pecho. °· Le falta el aire. °· Se desmaya. °· Siente dolor en la pierna. °· Siente dolor en el estómago. °· El sangrado vaginal satura dos o más apósitos en 1 hora. °ASEGÚRESE DE QUE:  °· Comprende estas instrucciones. °· Controlará su enfermedad. °· Recibirá ayuda de inmediato si no mejora o si empeora. °  °Esta información no tiene como fin reemplazar el consejo del médico. Asegúrese de hacerle al médico cualquier pregunta que tenga. °  °Document Released: 01/22/2005 Document Revised: 02/12/2014 °Elsevier Interactive Patient Education ©2016 Elsevier Inc. ° °

## 2015-09-20 NOTE — Progress Notes (Signed)
I assisted Dr. Genevie AnnSchenk with explanation of care plan.  Eda H Royal Interpreter.

## 2015-09-20 NOTE — Discharge Summary (Signed)
OB Discharge Summary     Patient Name: Lindsay NidaRaquel S Morrison DOB: [birth year] MRN: [id number]  Date of admission: 09/18/2015 Delivering MD: Jaynie CollinsANYANWU, UGONNA A   Date of discharge: 09/20/2015  Admitting diagnosis: PREVIOUS C SECTION  Intrauterine pregnancy: 4859w2d     Secondary diagnosis:  Active Problems:   S/P cesarean section  Additional problems: pre-gestational DM, gestational hypertension     Discharge diagnosis: Term Pregnancy Delivered                                                                                                Post partum procedures:none  Augmentation: N/A  Complications: None  Hospital course:  Sceduled C/S   30 y.o. yo G3P3003 at 1659w2d was admitted to the hospital 09/18/2015 for scheduled cesarean section with the following indication:Elective Repeat.  Membrane Rupture Time/Date: 10:01 AM ,09/18/2015   Patient delivered a Viable infant.09/18/2015  Details of operation can be found in separate operative note.  Pateint had an uncomplicated postpartum course.  She is ambulating, tolerating a regular diet, passing flatus, and urinating well. Patient is discharged home in stable condition on  09/20/15          Physical exam  Vitals:   09/19/15 0500 09/19/15 0515 09/19/15 1809 09/20/15 0629  BP: (!) 89/44 96/88 (!) 99/57 (!) 107/56  Pulse: 80  63 (!) 57  Resp: 20  18 18   Temp: 98.6 F (37 C)  98.3 F (36.8 C) 97.4 F (36.3 C)  TempSrc:   Oral Oral  SpO2: 100%     Weight:      Height:       General: alert, cooperative and no distress Lochia: appropriate Uterine Fundus: firm Incision: Dressing is clean, dry, and intact DVT Evaluation: No evidence of DVT seen on physical exam. No significant calf/ankle edema. Labs: Lab Results  Component Value Date   WBC 8.8 09/19/2015   HGB 11.4 (L) 09/19/2015   HCT 31.9 (L) 09/19/2015   MCV 92.7 09/19/2015   PLT 173 09/19/2015   CMP Latest Ref Rng & Units 09/16/2015  Glucose 65 - 99 mg/dL 86  BUN 6 - 20 mg/dL 6   Creatinine 1.300.44 - 8.651.00 mg/dL 7.840.52  Sodium 696135 - 295145 mmol/L 135  Potassium 3.5 - 5.1 mmol/L 3.7  Chloride 101 - 111 mmol/L 107  CO2 22 - 32 mmol/L 19(L)  Calcium 8.9 - 10.3 mg/dL 9.2  Total Protein 6.1 - 8.1 g/dL -  Total Bilirubin 0.2 - 1.2 mg/dL -  Alkaline Phos 33 - 284115 U/L -  AST 10 - 30 U/L -  ALT 6 - 29 U/L -    Discharge instruction: per After Visit Summary and "Baby and Me Booklet".  After visit meds:    Medication List    STOP taking these medications   aspirin EC 81 MG tablet   insulin regular 100 units/mL injection Commonly known as:  NOVOLIN R,HUMULIN R     TAKE these medications   insulin NPH Human 100 UNIT/ML injection Commonly known as:  HUMULIN N,NOVOLIN N Inject 0.1 mLs (10 Units total) into the skin  2 (two) times daily at 8 am and 10 pm. What changed:  how much to take  when to take this  additional instructions   multivitamin-prenatal 27-0.8 MG Tabs tablet Take 1 tablet by mouth daily.       Diet: routine diet  Activity: Advance as tolerated. Pelvic rest for 6 weeks.   Outpatient follow up:6 weeks Follow up Appt:No future appointments. Follow up Visit:No Follow-up on file.  Postpartum contraception: Vasectomy  Newborn Data: Live born female  Birth Weight: 11 lb 3.4 oz (5085 g) APGAR: 9, 9  Baby Feeding: Breast Disposition:home with mother   09/20/2015 Ernestina PennaNicholas Davene Jobin, MD

## 2015-09-20 NOTE — Progress Notes (Signed)
Notified Dr Chanetta Marshallimberlake patient asking for pain medication prescription at discharge.

## 2015-09-28 ENCOUNTER — Encounter: Payer: Self-pay | Admitting: *Deleted

## 2015-09-28 ENCOUNTER — Ambulatory Visit: Payer: Self-pay

## 2015-09-28 VITALS — BP 134/89 | HR 94 | Wt 176.1 lb

## 2015-09-28 DIAGNOSIS — Z5189 Encounter for other specified aftercare: Secondary | ICD-10-CM

## 2015-09-28 NOTE — Progress Notes (Signed)
Video Interpreter [phone]  Pt here today for wound check s/p c-section on 09/18/15.  Incision well-approximated, no odor, no pain, no drainage.  Pt notified of PP visit on 11/16/15.  Recommended pt to monitor for signs of infection.  Pt agreed.

## 2015-11-16 ENCOUNTER — Ambulatory Visit (INDEPENDENT_AMBULATORY_CARE_PROVIDER_SITE_OTHER): Payer: Self-pay | Admitting: Obstetrics & Gynecology

## 2015-11-16 ENCOUNTER — Encounter: Payer: Self-pay | Admitting: Obstetrics & Gynecology

## 2015-11-16 DIAGNOSIS — E139 Other specified diabetes mellitus without complications: Secondary | ICD-10-CM

## 2015-11-16 DIAGNOSIS — Z794 Long term (current) use of insulin: Secondary | ICD-10-CM

## 2015-11-16 MED ORDER — NORGESTIMATE-ETH ESTRADIOL 0.25-35 MG-MCG PO TABS: 1.0000 | ORAL_TABLET | Freq: Every day | ORAL | 11 refills | Status: AC

## 2015-11-16 NOTE — Progress Notes (Signed)
Subjective:     Lindsay Morrison is a 30 y.o. 263P3003  female who presents for a postpartum visit. She is 8 weeks postpartum following a low transverse cesarean section. I have fully reviewed the prenatal and intrapartum course; had preexisting DM and HTN. Was on insulin prior to pregnancy, no HTN meds (actually denies HTN history prior to pregnancy).  The delivery was at 39 gestational weeks. Outcome: repeat cesarean section, low transverse incision. Anesthesia: spinal. Postpartum course has been unremarkable. Baby's course has been unremarkable. Baby is feeding by bottle - Similac Advance. Bleeding no bleeding. Bowel function is normal. Bladder function is normal. Patient is sexually active. Contraception method is condoms. Postpartum depression screening: negative.  Spanish video interpreter "Reginia Fortsablo" [phone]  The following portions of the patient's history were reviewed and updated as appropriate: allergies, current medications, past family history, past medical history, past social history, past surgical history and problem list.  Normal pap 02/22/2015.  Review of Systems Pertinent items noted in HPI and remainder of comprehensive ROS otherwise negative.   Objective:    BP 113/65   Pulse 67   Breastfeeding? No   General:  alert and no distress   Breasts:  deferred  Lungs: clear to auscultation bilaterally  Heart:  regular rate and rhythm  Abdomen: soft, non-tender; bowel sounds normal; no masses,  no organomegaly and incision C/D/I  Pelvic:  not evaluated        Assessment:   Normal postpartum exam.   Plan:   1. Contraception: OCP (estrogen/progesterone) prescribed 2. Will follow up with PCP for management of HTN and DM 3. Follow up  as needed.     Marland Kitchen.Jaynie CollinsUGONNA  ANYANWU, MD, FACOG Attending Obstetrician & Gynecologist, [facility], [facility]

## 2015-11-16 NOTE — Patient Instructions (Signed)
Regrese a la clinica cuando tenga su cita. Si tiene problemas o preguntas, llama a la clinica o vaya a la [facility] al [facility].    

## 2016-03-13 ENCOUNTER — Other Ambulatory Visit: Payer: Self-pay | Admitting: Advanced Practice Midwife
